# Patient Record
Sex: Female | Born: 1985 | Race: Black or African American | Hispanic: No | Marital: Single | Smoking: Current every day smoker
Health system: Southern US, Community
[De-identification: ages and names within clinical notes are randomized; demographics above are authoritative.]

---
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
## 2007-05-31 ENCOUNTER — Emergency Department (HOSPITAL_COMMUNITY): Admission: EM | Admit: 2007-05-31 | Discharge: 2007-05-31 | Payer: Self-pay | Admitting: Emergency Medicine

## 2008-05-17 ENCOUNTER — Emergency Department (HOSPITAL_BASED_OUTPATIENT_CLINIC_OR_DEPARTMENT_OTHER): Admission: EM | Admit: 2008-05-17 | Discharge: 2008-05-17 | Payer: Self-pay | Admitting: Emergency Medicine

## 2008-06-15 ENCOUNTER — Emergency Department (HOSPITAL_BASED_OUTPATIENT_CLINIC_OR_DEPARTMENT_OTHER): Admission: EM | Admit: 2008-06-15 | Discharge: 2008-06-15 | Payer: Self-pay | Admitting: Emergency Medicine

## 2008-10-20 ENCOUNTER — Emergency Department (HOSPITAL_BASED_OUTPATIENT_CLINIC_OR_DEPARTMENT_OTHER): Admission: EM | Admit: 2008-10-20 | Discharge: 2008-10-20 | Payer: Self-pay | Admitting: Emergency Medicine

## 2010-02-15 ENCOUNTER — Emergency Department (HOSPITAL_BASED_OUTPATIENT_CLINIC_OR_DEPARTMENT_OTHER)
Admission: EM | Admit: 2010-02-15 | Discharge: 2010-02-15 | Payer: Self-pay | Source: Home / Self Care | Admitting: Emergency Medicine

## 2010-06-07 LAB — WET PREP, GENITAL

## 2010-06-07 LAB — DIFFERENTIAL
Eosinophils Relative: 1 % (ref 0–5)
Lymphocytes Relative: 12 % (ref 12–46)
Lymphs Abs: 1.7 10*3/uL (ref 0.7–4.0)
Monocytes Absolute: 1.2 10*3/uL — ABNORMAL HIGH (ref 0.1–1.0)
Neutro Abs: 11.2 10*3/uL — ABNORMAL HIGH (ref 1.7–7.7)

## 2010-06-07 LAB — CBC
MCHC: 34.3 g/dL (ref 30.0–36.0)
MCV: 89.3 fL (ref 78.0–100.0)
Platelets: 251 10*3/uL (ref 150–400)
RDW: 12.3 % (ref 11.5–15.5)
WBC: 14.4 10*3/uL — ABNORMAL HIGH (ref 4.0–10.5)

## 2010-06-07 LAB — URINALYSIS, ROUTINE W REFLEX MICROSCOPIC
Bilirubin Urine: NEGATIVE
Glucose, UA: NEGATIVE mg/dL
Ketones, ur: NEGATIVE mg/dL
Nitrite: POSITIVE — AB
Protein, ur: 100 mg/dL — AB
Specific Gravity, Urine: 1.018 (ref 1.005–1.030)
Urobilinogen, UA: 1 mg/dL (ref 0.0–1.0)
pH: 6 (ref 5.0–8.0)

## 2010-06-07 LAB — PREGNANCY, URINE: Preg Test, Ur: POSITIVE

## 2010-06-07 LAB — GC/CHLAMYDIA PROBE AMP, GENITAL
Chlamydia, DNA Probe: NEGATIVE
GC Probe Amp, Genital: NEGATIVE

## 2010-06-07 LAB — COMPREHENSIVE METABOLIC PANEL
AST: 17 U/L (ref 0–37)
Albumin: 4 g/dL (ref 3.5–5.2)
BUN: 7 mg/dL (ref 6–23)
Calcium: 8.7 mg/dL (ref 8.4–10.5)
Chloride: 105 mEq/L (ref 96–112)
Creatinine, Ser: 0.7 mg/dL (ref 0.4–1.2)
GFR calc Af Amer: >60 mL/min (ref >60)
Total Protein: 7.6 g/dL (ref 6.0–8.3)

## 2010-06-07 LAB — URINE MICROSCOPIC-ADD ON

## 2010-06-07 LAB — RPR: RPR Ser Ql: NONREACTIVE

## 2010-07-16 ENCOUNTER — Emergency Department (HOSPITAL_BASED_OUTPATIENT_CLINIC_OR_DEPARTMENT_OTHER)
Admission: EM | Admit: 2010-07-16 | Discharge: 2010-07-16 | Disposition: A | Discharge status: Home / Self Care | Payer: Medicaid Other | Attending: Emergency Medicine | Admitting: Emergency Medicine

## 2010-07-16 DIAGNOSIS — L089 Local infection of the skin and subcutaneous tissue, unspecified: Secondary | ICD-10-CM | POA: Insufficient documentation

## 2011-01-03 ENCOUNTER — Emergency Department (HOSPITAL_BASED_OUTPATIENT_CLINIC_OR_DEPARTMENT_OTHER)
Admission: EM | Admit: 2011-01-03 | Discharge: 2011-01-03 | Disposition: A | Discharge status: Home / Self Care | Payer: No Typology Code available for payment source | Attending: Emergency Medicine | Admitting: Emergency Medicine

## 2011-01-03 ENCOUNTER — Encounter: Payer: Self-pay | Admitting: Family Medicine

## 2011-01-03 ENCOUNTER — Emergency Department (INDEPENDENT_AMBULATORY_CARE_PROVIDER_SITE_OTHER): Payer: No Typology Code available for payment source

## 2011-01-03 ENCOUNTER — Other Ambulatory Visit: Payer: Self-pay

## 2011-01-03 DIAGNOSIS — Y92410 Unspecified street and highway as the place of occurrence of the external cause: Secondary | ICD-10-CM | POA: Insufficient documentation

## 2011-01-03 DIAGNOSIS — R0789 Other chest pain: Secondary | ICD-10-CM | POA: Insufficient documentation

## 2011-01-03 DIAGNOSIS — M545 Low back pain, unspecified: Secondary | ICD-10-CM | POA: Insufficient documentation

## 2011-01-03 DIAGNOSIS — M25519 Pain in unspecified shoulder: Secondary | ICD-10-CM
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
## 2011-01-08 ENCOUNTER — Emergency Department (HOSPITAL_BASED_OUTPATIENT_CLINIC_OR_DEPARTMENT_OTHER)
Admission: EM | Admit: 2011-01-08 | Discharge: 2011-01-08 | Disposition: A | Discharge status: Home / Self Care | Payer: No Typology Code available for payment source | Attending: Emergency Medicine | Admitting: Emergency Medicine

## 2011-01-08 ENCOUNTER — Encounter (HOSPITAL_BASED_OUTPATIENT_CLINIC_OR_DEPARTMENT_OTHER): Payer: Self-pay

## 2011-01-08 DIAGNOSIS — F17200 Nicotine dependence, unspecified, uncomplicated: Secondary | ICD-10-CM | POA: Insufficient documentation

## 2011-01-08 DIAGNOSIS — T148XXA Other injury of unspecified body region, initial encounter: Secondary | ICD-10-CM

## 2011-01-08 DIAGNOSIS — IMO0002 Reserved for concepts with insufficient information to code with codable children: Secondary | ICD-10-CM | POA: Insufficient documentation

## 2011-01-08 MED ORDER — CYCLOBENZAPRINE HCL 5 MG PO TABS: 5.0000 mg | ORAL_TABLET | Freq: Three times a day (TID) | ORAL | Status: AC | PRN
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
## 2011-01-08 NOTE — ED Provider Notes (Signed)
History/physical exam/procedure(s) were performed by non-physician practitioner and as supervising physician I was immediately available for consultation/collaboration. I have reviewed all notes and am in agreement with care and plan.   Hilario Quarry, MD 01/08/11 2227
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
## 2014-06-27 ENCOUNTER — Encounter (HOSPITAL_BASED_OUTPATIENT_CLINIC_OR_DEPARTMENT_OTHER): Payer: Self-pay

## 2014-06-27 ENCOUNTER — Emergency Department (HOSPITAL_BASED_OUTPATIENT_CLINIC_OR_DEPARTMENT_OTHER)
Admission: EM | Admit: 2014-06-27 | Discharge: 2014-06-27 | Disposition: A | Discharge status: Home / Self Care | Payer: Medicaid Other | Attending: Emergency Medicine | Admitting: Emergency Medicine

## 2014-06-27 DIAGNOSIS — Y9389 Activity, other specified: Secondary | ICD-10-CM | POA: Insufficient documentation

## 2014-06-27 DIAGNOSIS — T161XXA Foreign body in right ear, initial encounter: Secondary | ICD-10-CM | POA: Insufficient documentation

## 2014-06-27 DIAGNOSIS — Y9289 Other specified places as the place of occurrence of the external cause: Secondary | ICD-10-CM | POA: Insufficient documentation

## 2014-06-27 DIAGNOSIS — X58XXXA Exposure to other specified factors, initial encounter: Secondary | ICD-10-CM | POA: Insufficient documentation

## 2014-06-27 DIAGNOSIS — Z720 Tobacco use: Secondary | ICD-10-CM | POA: Insufficient documentation

## 2014-06-27 DIAGNOSIS — Y998 Other external cause status: Secondary | ICD-10-CM | POA: Insufficient documentation

## 2014-06-27 NOTE — ED Provider Notes (Signed)
CSN: [id number]     Arrival date & time 06/27/14  [id number] History   First MD Initiated Contact with Patient 06/27/14 [phone]     Chief Complaint  Patient presents with  . Foreign Body in Ear     (Consider location/radiation/quality/duration/timing/severity/associated sxs/prior Treatment) HPI  Pamela Sparks is a 29 y.o. female presenting with foreign body in right ear. Patient stated she was using a Q-tip today and removed only part of it. Patient states she can hear but it is decreased. She denies any fevers, chills, congestion. No headache. Patient denies any ear drainage or external ear pain. Pt condition has been chronic, unchanged. No alleviating or aggravating factors.    History reviewed. No pertinent past medical history. History reviewed. No pertinent past surgical history. No family history on file. History  Substance Use Topics  . Smoking status: Current Every Day Smoker -- 1.00 packs/day    Types: Cigarettes  . Smokeless tobacco: Not on file  . Alcohol Use: No   OB History    No data available     Review of Systems  Constitutional: Negative for fever and chills.  HENT: Negative for congestion, ear discharge and ear pain.   Skin: Positive for color change. Negative for rash.      Allergies  Review of patient's allergies indicates no known allergies.  Home Medications   Prior to Admission medications   Medication Sig Start Date End Date Taking? Authorizing Provider  levonorgestrel (MIRENA) 20 MCG/24HR IUD 1 each by Intrauterine route once.      Historical Provider, MD   BP 125/83 mmHg  Pulse 82  Temp(Src) 98.2 F (36.8 C) (Oral)  Resp 16  Ht 5\' 2"  (1.575 m)  Wt 120 lb (54.432 kg)  BMI 21.94 kg/m2  SpO2 100%  LMP 05/30/2014 Physical Exam  Constitutional: She appears well-developed and well-nourished. No distress.  HENT:  Head: Normocephalic and atraumatic.  Right Ear: External ear normal.  Left Ear: External ear normal.  White foreign body in front  of TM obscuring it. After removal TM without erythema. No retraction. Light reflex intact as well as landmarks. No external ear skin changes or tenderness. No mastoid tenderness.  Eyes: Conjunctivae are normal. Right eye exhibits no discharge. Left eye exhibits no discharge.  Pulmonary/Chest: Effort normal. No respiratory distress.  Neurological: She is alert. Coordination normal.  Skin: She is not diaphoretic.  Psychiatric: She has a normal mood and affect. Her behavior is normal.  Nursing note and vitals reviewed.   ED Course  Procedures (including critical care time) Labs Review Labs Reviewed - No data to display  Imaging Review No results found.   EKG Interpretation None      MDM   Final diagnoses:  Foreign body in ear, right, initial encounter   Patient presenting with foreign-body in right ear with decreased hearing. Foreign body was removed with suction. Patient stated hearing return immediately. TM without evidence of infection. No TM perforation. Patient not to put any Q-tips or foreign bodies in the ear. She is agreeable to this plan.   Oswaldo ConroyVictoria Gabryel Files, PA-C 06/27/14 [id number]  Oswaldo ConroyVictoria Sanya Kobrin, PA-C 06/27/14 1004  Blane OharaJoshua Zavitz, MD 06/29/14 [phone]

## 2014-06-27 NOTE — ED Notes (Signed)
Reports putting q-tip in right ear. Unable to hear at this time

## 2014-06-27 NOTE — Discharge Instructions (Signed)
Return to the emergency room with worsening of symptoms, new symptoms or with symptoms that are concerning, especially fevers, ear drainage, unable to hear out of her right ear, severe headache, dizziness, severe pain. Nothing in your ear. Do not use Q-tips. To remove wax use warm water while in the shower. Read below information and follow recommendations.  Ear Foreign Body An ear foreign body is an object that is stuck in the ear. It is common for young children to put objects into the ear canal. These may include pebbles, beads, beans, and any other small objects which will fit. In adults, objects such as cotton swabs may become lodged in the ear canal. In all ages, the most common foreign bodies are insects that enter the ear canal.  SYMPTOMS  Foreign bodies may cause pain, buzzing or roaring sounds, hearing loss, and ear drainage.  HOME CARE INSTRUCTIONS   Keep all follow-up appointments with your caregiver as told.  Keep small objects out of reach of young children. Tell them not to put anything in their ears. SEEK IMMEDIATE MEDICAL CARE IF:   You have bleeding from the ear.  You have increased pain or swelling of the ear.  You have reduced hearing.  You have discharge coming from the ear.  You have a fever.  You have a headache. MAKE SURE YOU:   Understand these instructions.  Will watch your condition.  Will get help right away if you are not doing well or get worse. Document Released: 02/09/2000 Document Revised: 05/06/2011 Document Reviewed: 09/30/2007 [facility] Patient Information 2015 [place], [place]. This information is not intended to replace advice given to you by your health care provider. Make sure you discuss any questions you have with your health care provider.

## 2015-08-23 ENCOUNTER — Emergency Department (HOSPITAL_BASED_OUTPATIENT_CLINIC_OR_DEPARTMENT_OTHER)
Admission: EM | Admit: 2015-08-23 | Discharge: 2015-08-23 | Disposition: A | Discharge status: Home / Self Care | Payer: BLUE CROSS/BLUE SHIELD | Attending: Emergency Medicine | Admitting: Emergency Medicine

## 2015-08-23 ENCOUNTER — Emergency Department (HOSPITAL_BASED_OUTPATIENT_CLINIC_OR_DEPARTMENT_OTHER): Payer: BLUE CROSS/BLUE SHIELD

## 2015-08-23 ENCOUNTER — Encounter (HOSPITAL_BASED_OUTPATIENT_CLINIC_OR_DEPARTMENT_OTHER): Payer: Self-pay | Admitting: *Deleted

## 2015-08-23 DIAGNOSIS — N390 Urinary tract infection, site not specified: Secondary | ICD-10-CM | POA: Diagnosis not present

## 2015-08-23 DIAGNOSIS — F17210 Nicotine dependence, cigarettes, uncomplicated: Secondary | ICD-10-CM | POA: Diagnosis not present

## 2015-08-23 DIAGNOSIS — R112 Nausea with vomiting, unspecified: Secondary | ICD-10-CM | POA: Diagnosis present

## 2015-08-23 DIAGNOSIS — J40 Bronchitis, not specified as acute or chronic: Secondary | ICD-10-CM | POA: Diagnosis not present

## 2015-08-23 LAB — COMPREHENSIVE METABOLIC PANEL
ALBUMIN: 4.5 g/dL (ref 3.5–5.0)
ALT: 15 U/L (ref 14–54)
ANION GAP: 6 (ref 5–15)
AST: 20 U/L (ref 15–41)
Alkaline Phosphatase: 61 U/L (ref 38–126)
BUN: 10 mg/dL (ref 6–20)
CHLORIDE: 105 mmol/L (ref 101–111)
CO2: 24 mmol/L (ref 22–32)
Calcium: 9.1 mg/dL (ref 8.9–10.3)
Creatinine, Ser: 0.92 mg/dL (ref 0.44–1.00)
GFR calc Af Amer: >60 mL/min (ref >60)
GFR calc non Af Amer: >60 mL/min (ref >60)
GLUCOSE: 94 mg/dL (ref 65–99)
POTASSIUM: 3.4 mmol/L — AB (ref 3.5–5.1)
SODIUM: 135 mmol/L (ref 135–145)
Total Bilirubin: 0.6 mg/dL (ref 0.3–1.2)
Total Protein: 7.7 g/dL (ref 6.5–8.1)

## 2015-08-23 LAB — URINALYSIS, ROUTINE W REFLEX MICROSCOPIC
Glucose, UA: NEGATIVE mg/dL
Ketones, ur: 15 mg/dL — AB
NITRITE: NEGATIVE
PROTEIN: 30 mg/dL — AB
Specific Gravity, Urine: 1.027 (ref 1.005–1.030)
pH: 5.5 (ref 5.0–8.0)

## 2015-08-23 LAB — URINE MICROSCOPIC-ADD ON

## 2015-08-23 LAB — CBC
HCT: 41.2 % (ref 36.0–46.0)
Hemoglobin: 14.7 g/dL (ref 12.0–15.0)
MCH: 32.4 pg (ref 26.0–34.0)
MCHC: 35.7 g/dL (ref 30.0–36.0)
MCV: 90.7 fL (ref 78.0–100.0)
Platelets: 188 10*3/uL (ref 150–400)
RBC: 4.54 MIL/uL (ref 3.87–5.11)
RDW: 12.7 % (ref 11.5–15.5)
WBC: 10 10*3/uL (ref 4.0–10.5)

## 2015-08-23 LAB — LIPASE, BLOOD: Lipase: 13 U/L (ref 11–51)

## 2015-08-23 LAB — PREGNANCY, URINE: PREG TEST UR: NEGATIVE

## 2015-08-23 MED ORDER — SODIUM CHLORIDE 0.9 % IV BOLUS (SEPSIS)
2000.0000 mL | Freq: Once | INTRAVENOUS | Status: AC
  Administered 2015-08-23: 2000 mL via INTRAVENOUS

## 2015-08-23 MED ORDER — ALBUTEROL SULFATE (2.5 MG/3ML) 0.083% IN NEBU
5.0000 mg | INHALATION_SOLUTION | Freq: Once | RESPIRATORY_TRACT | Status: AC
  Administered 2015-08-23: 5 mg via RESPIRATORY_TRACT
  Filled 2015-08-23: qty 6

## 2015-08-23 MED ORDER — CEPHALEXIN 500 MG PO CAPS: 500.0000 mg | ORAL_CAPSULE | Freq: Four times a day (QID) | ORAL | Status: DC

## 2015-08-23 MED ORDER — ALBUTEROL SULFATE HFA 108 (90 BASE) MCG/ACT IN AERS
1.0000 | INHALATION_SPRAY | RESPIRATORY_TRACT | Status: DC | PRN
  Administered 2015-08-23: 2 via RESPIRATORY_TRACT
  Filled 2015-08-23: qty 6.7

## 2015-08-23 MED ORDER — DEXTROSE 5 % IV SOLN
1.0000 g | Freq: Once | INTRAVENOUS | Status: AC
  Administered 2015-08-23: 1 g via INTRAVENOUS
  Filled 2015-08-23: qty 10

## 2015-08-23 MED ORDER — DEXAMETHASONE SODIUM PHOSPHATE 10 MG/ML IJ SOLN
10.0000 mg | Freq: Once | INTRAMUSCULAR | Status: AC
Start: 2015-08-23 — End: 2015-08-23
  Administered 2015-08-23: 10 mg via INTRAVENOUS
  Filled 2015-08-23: qty 1

## 2015-08-23 NOTE — ED Notes (Signed)
Patient transported to X-ray 

## 2015-08-23 NOTE — ED Notes (Signed)
MD at bedside. 

## 2015-08-23 NOTE — Discharge Instructions (Signed)
You were seen and evaluated today for your symptoms of generally not feeling well as well as her inability to eat secondary to nauseousness. Your results were consistent with bronchitis as well as probable urinary tract infection. Use the inhaler as needed for shortness of breath. Take the antibiotics for UTI. Please keep yourself well-hydrated as it appears you were dehydrated secondary to being sick. Drink at least 8x8 ounce glasses of water a day. Follow-up with your primary care physician for reevaluation.  Acute Bronchitis Bronchitis is inflammation of the airways that extend from the windpipe into the lungs (bronchi). The inflammation often causes mucus to develop. This leads to a cough, which is the most common symptom of bronchitis.  In acute bronchitis, the condition usually develops suddenly and goes away over time, usually in a couple weeks. Smoking, allergies, and asthma can make bronchitis worse. Repeated episodes of bronchitis may cause further lung problems.  CAUSES Acute bronchitis is most often caused by the same virus that causes a cold. The virus can spread from person to person (contagious) through coughing, sneezing, and touching contaminated objects. SIGNS AND SYMPTOMS   Cough.   Fever.   Coughing up mucus.   Body aches.   Chest congestion.   Chills.   Shortness of breath.   Sore throat.  DIAGNOSIS  Acute bronchitis is usually diagnosed through a physical exam. Your health care provider will also ask you questions about your medical history. Tests, such as chest X-rays, are sometimes done to rule out other conditions.  TREATMENT  Acute bronchitis usually goes away in a couple weeks. Oftentimes, no medical treatment is necessary. Medicines are sometimes given for relief of fever or cough. Antibiotic medicines are usually not needed but may be prescribed in certain situations. In some cases, an inhaler may be recommended to help reduce shortness of breath and  control the cough. A cool mist vaporizer may also be used to help thin bronchial secretions and make it easier to clear the chest.  HOME CARE INSTRUCTIONS  Get plenty of rest.   Drink enough fluids to keep your urine clear or pale yellow (unless you have a medical condition that requires fluid restriction). Increasing fluids may help thin your respiratory secretions (sputum) and reduce chest congestion, and it will prevent dehydration.   Take medicines only as directed by your health care provider.  If you were prescribed an antibiotic medicine, finish it all even if you start to feel better.  Avoid smoking and secondhand smoke. Exposure to cigarette smoke or irritating chemicals will make bronchitis worse. If you are a smoker, consider using nicotine gum or skin patches to help control withdrawal symptoms. Quitting smoking will help your lungs heal faster.   Reduce the chances of another bout of acute bronchitis by washing your hands frequently, avoiding people with cold symptoms, and trying not to touch your hands to your mouth, nose, or eyes.   Keep all follow-up visits as directed by your health care provider.  SEEK MEDICAL CARE IF: Your symptoms do not improve after 1 week of treatment.  SEEK IMMEDIATE MEDICAL CARE IF:  You develop an increased fever or chills.   You have chest pain.   You have severe shortness of breath.  You have bloody sputum.   You develop dehydration.  You faint or repeatedly feel like you are going to pass out.  You develop repeated vomiting.  You develop a severe headache. MAKE SURE YOU:   Understand these instructions.  Will watch your  condition.  Will get help right away if you are not doing well or get worse.   This information is not intended to replace advice given to you by your health care provider. Make sure you discuss any questions you have with your health care provider.   Document Released: 03/21/2004 Document Revised:  03/04/2014 Document Reviewed: 08/04/2012 Elsevier Interactive Patient Education 2016 Elsevier Inc.   Urinary Tract Infection Urinary tract infections (UTIs) can develop anywhere along your urinary tract. Your urinary tract is your body's drainage system for removing wastes and extra water. Your urinary tract includes two kidneys, two ureters, a bladder, and a urethra. Your kidneys are a pair of bean-shaped organs. Each kidney is about the size of your fist. They are located below your ribs, one on each side of your spine. CAUSES Infections are caused by microbes, which are microscopic organisms, including fungi, viruses, and bacteria. These organisms are so small that they can only be seen through a microscope. Bacteria are the microbes that most commonly cause UTIs. SYMPTOMS  Symptoms of UTIs may vary by age and gender of the patient and by the location of the infection. Symptoms in young women typically include a frequent and intense urge to urinate and a painful, burning feeling in the bladder or urethra during urination. Older women and men are more likely to be tired, shaky, and weak and have muscle aches and abdominal pain. A fever may mean the infection is in your kidneys. Other symptoms of a kidney infection include pain in your back or sides below the ribs, nausea, and vomiting. DIAGNOSIS To diagnose a UTI, your caregiver will ask you about your symptoms. Your caregiver will also ask you to provide a urine sample. The urine sample will be tested for bacteria and white blood cells. White blood cells are made by your body to help fight infection. TREATMENT  Typically, UTIs can be treated with medication. Because most UTIs are caused by a bacterial infection, they usually can be treated with the use of antibiotics. The choice of antibiotic and length of treatment depend on your symptoms and the type of bacteria causing your infection. HOME CARE INSTRUCTIONS  If you were prescribed antibiotics,  take them exactly as your caregiver instructs you. Finish the medication even if you feel better after you have only taken some of the medication.  Drink enough water and fluids to keep your urine clear or pale yellow.  Avoid caffeine, tea, and carbonated beverages. They tend to irritate your bladder.  Empty your bladder often. Avoid holding urine for long periods of time.  Empty your bladder before and after sexual intercourse.  After a bowel movement, women should cleanse from front to back. Use each tissue only once. SEEK MEDICAL CARE IF:   You have back pain.  You develop a fever.  Your symptoms do not begin to resolve within 3 days. SEEK IMMEDIATE MEDICAL CARE IF:   You have severe back pain or lower abdominal pain.  You develop chills.  You have nausea or vomiting.  You have continued burning or discomfort with urination. MAKE SURE YOU:   Understand these instructions.  Will watch your condition.  Will get help right away if you are not doing well or get worse.   This information is not intended to replace advice given to you by your health care provider. Make sure you discuss any questions you have with your health care provider.   Document Released: 11/21/2004 Document Revised: 11/02/2014 Document Reviewed: 03/22/2011  Elsevier Interactive Patient Education ©2016 Elsevier Inc. ° °

## 2015-08-23 NOTE — ED Notes (Signed)
Pt reports 10 days of decreased appetite, body aches, fatigue, and vomiting

## 2015-08-23 NOTE — ED Provider Notes (Signed)
CSN: [id number]     Arrival date & time 08/23/15  0930 History   First MD Initiated Contact with Patient 08/23/15 0945     Chief Complaint  Patient presents with  . Emesis     (Consider location/radiation/quality/duration/timing/severity/associated sxs/prior Treatment) HPI Comments: 30 year old female with no significant past medical history presents for 10 days of decreased appetite, body aches, fatigue, nausea and vomiting. She denies any throat pain. She denies any known fever. She says she has been somewhat short of breath but denies any cough. She says she does intermittently have headaches. She has been trying to force yourself to drink fluids without much improvement. Denies any known sick contacts. Denies abdominal pain except for when she is vomiting. She does not believe that she could be pregnant. Has had decreased urination and dark colored urine.  Patient is a 30 y.o. female presenting with vomiting.  Emesis Associated symptoms: myalgias   Associated symptoms: no abdominal pain, no diarrhea and no headaches     History reviewed. No pertinent past medical history. History reviewed. No pertinent past surgical history. No family history on file. Social History  Substance Use Topics  . Smoking status: Current Every Day Smoker -- 1.00 packs/day    Types: Cigarettes  . Smokeless tobacco: Never Used  . Alcohol Use: No   OB History    No data available     Review of Systems  Constitutional: Positive for appetite change and fatigue. Negative for fever.  HENT: Negative for congestion, postnasal drip, rhinorrhea and sinus pressure.   Eyes: Negative for visual disturbance.  Respiratory: Positive for shortness of breath. Negative for cough, chest tightness and wheezing.   Cardiovascular: Negative for chest pain, palpitations and leg swelling.  Gastrointestinal: Positive for nausea and vomiting. Negative for abdominal pain and diarrhea.  Genitourinary: Positive for decreased  urine volume. Negative for dysuria, hematuria and flank pain.  Musculoskeletal: Positive for myalgias. Negative for back pain.  Skin: Negative for rash.  Neurological: Negative for dizziness, weakness and headaches.  Hematological: Does not bruise/bleed easily.      Allergies  Review of patient's allergies indicates no known allergies.  Home Medications   Prior to Admission medications   Medication Sig Start Date End Date Taking? Authorizing Provider  cephALEXin (KEFLEX) 500 MG capsule Take 1 capsule (500 mg total) by mouth 4 (four) times daily. 08/23/15   Leta BaptistEmily Roe Lanier Felty, MD  levonorgestrel (MIRENA) 20 MCG/24HR IUD 1 each by Intrauterine route once.      Historical Provider, MD   BP 100/66 mmHg  Pulse 86  Temp(Src) 99.4 F (37.4 C) (Oral)  Resp 18  Ht 5\' 2"  (1.575 m)  Wt 125 lb (56.7 kg)  BMI 22.86 kg/m2  SpO2 98%  LMP 07/27/2015 (Approximate) Physical Exam  Constitutional: She is oriented to person, place, and time. She appears well-developed and well-nourished. No distress.  HENT:  Head: Normocephalic and atraumatic.  Right Ear: Tympanic membrane and external ear normal.  Left Ear: Tympanic membrane and external ear normal.  Nose: Nose normal.  Mouth/Throat: Oropharynx is clear and moist. Mucous membranes are dry. No oropharyngeal exudate, posterior oropharyngeal edema or posterior oropharyngeal erythema.  Eyes: EOM are normal. Pupils are equal, round, and reactive to light.  Neck: Normal range of motion. Neck supple.  Cardiovascular: Normal rate, regular rhythm, normal heart sounds and intact distal pulses.   No murmur heard. Pulmonary/Chest: Effort normal. No respiratory distress. She has no wheezes. She has no rales.  Abdominal: Soft. She exhibits  no distension. There is no tenderness.  Musculoskeletal: Normal range of motion. She exhibits no edema or tenderness.  Neurological: She is alert and oriented to person, place, and time.  Skin: Skin is warm and dry. No  rash noted. She is not diaphoretic.  Vitals reviewed.   ED Course  Procedures (including critical care time) Labs Review Labs Reviewed  URINALYSIS, ROUTINE W REFLEX MICROSCOPIC (NOT AT [facility]) - Abnormal; Notable for the following:    Color, Urine AMBER (*)    APPearance TURBID (*)    Hgb urine dipstick LARGE (*)    Bilirubin Urine SMALL (*)    Ketones, ur 15 (*)    Protein, ur 30 (*)    Leukocytes, UA SMALL (*)    All other components within normal limits  COMPREHENSIVE METABOLIC PANEL - Abnormal; Notable for the following:    Potassium 3.4 (*)    All other components within normal limits  URINE MICROSCOPIC-ADD ON - Abnormal; Notable for the following:    Squamous Epithelial / LPF 0-5 (*)    Bacteria, UA MANY (*)    All other components within normal limits  URINE CULTURE  PREGNANCY, URINE  CBC  LIPASE, BLOOD    Imaging Review Dg Chest 2 View  08/23/2015  CLINICAL DATA:  Chest pain, shortness of breath, near syncope, nausea, vomiting, diarrhea, nervousness, and fatigue, symptoms for 10 days, smoker EXAM: CHEST  2 VIEW COMPARISON:  01/03/2011 FINDINGS: Normal heart size, mediastinal contours, and pulmonary vascularity. Mild chronic bronchitic changes. Lungs otherwise clear. No pleural effusion or pneumothorax. No acute bony abnormalities. IMPRESSION: Mild chronic bronchitic changes without infiltrate. Electronically Signed   By: Ulyses SouthwardMark  Boles M.D.   On: 08/23/2015 10:18   I have personally reviewed and evaluated these images and lab results as part of my medical decision-making.   EKG Interpretation None      MDM  Patient was seen and evaluated in stable condition. Benign examination although patient appeared dehydrated. Chest x-ray with bronchitic changes. Patient did admit to being a smoker and was educated on the need for smoking cessation. She was given a breathing treatment with improvement in her breathing. Laboratory studies were unremarkable other than UA concerning for  infection. Urine culture was sent. Patient was given a dose of Rocephin. Patient was rehydrated with 2 L of saline. On reevaluation she felt significantly improved and was able to tolerate by mouth without difficulty. She expressed understanding and agreement with plan for discharge and outpatient follow-up. Patient was discharged home with a prescription for Keflex and with albuterol inhaler. She was given a dose of Decadron in the emergency department. She was instructed to keep herself well-hydrated. Final diagnoses:  Bronchitis  UTI (lower urinary tract infection)    1.  Bronchitis 2. UTI    Leta BaptistEmily Roe Lezlee Gills, MD 08/23/15 1321

## 2015-08-23 NOTE — ED Notes (Signed)
Pt directed to pharmacy to pick up prescriptions. Pt has a ride at bedside

## 2015-08-25 LAB — URINE CULTURE: Culture: >=100000 — AB

## 2015-08-26 ENCOUNTER — Telehealth (HOSPITAL_BASED_OUTPATIENT_CLINIC_OR_DEPARTMENT_OTHER): Payer: Self-pay

## 2015-08-26 NOTE — Telephone Encounter (Signed)
Post ED Visit - Positive Culture Follow-up  Culture report reviewed by antimicrobial stewardship pharmacist:  []  Pamela Sparks, Pharm.D. []  [name], Pharm.D., BCPS []  Garvin FilaMike Maccia, Pharm.D. []  Georgina [name], Pharm.D., BCPS []  HouseMinh Sparks, [address] BoulevardPharm.D., BCPS, AAHIVP []  Estella HuskMichelle Sparks, Pharm.D., BCPS, AAHIVP [x]  Pamela Sparks, Pharm.D. []  [name], Pharm.D.  Positive urine culture Treated with Cephalexin, organism sensitive to the same and no further patient follow-up is required at this time.  Pamela Sparks, Pamela Sparks 08/26/2015, 2:24 PM

## 2015-08-27 ENCOUNTER — Emergency Department (HOSPITAL_BASED_OUTPATIENT_CLINIC_OR_DEPARTMENT_OTHER)
Admission: EM | Admit: 2015-08-27 | Discharge: 2015-08-27 | Disposition: A | Discharge status: Home / Self Care | Payer: BLUE CROSS/BLUE SHIELD | Attending: Emergency Medicine | Admitting: Emergency Medicine

## 2015-08-27 ENCOUNTER — Encounter (HOSPITAL_BASED_OUTPATIENT_CLINIC_OR_DEPARTMENT_OTHER): Payer: Self-pay | Admitting: *Deleted

## 2015-08-27 DIAGNOSIS — F419 Anxiety disorder, unspecified: Secondary | ICD-10-CM | POA: Diagnosis not present

## 2015-08-27 DIAGNOSIS — F17210 Nicotine dependence, cigarettes, uncomplicated: Secondary | ICD-10-CM | POA: Diagnosis not present

## 2015-08-27 DIAGNOSIS — Z79899 Other long term (current) drug therapy: Secondary | ICD-10-CM | POA: Insufficient documentation

## 2015-08-27 LAB — URINE MICROSCOPIC-ADD ON

## 2015-08-27 LAB — RAPID URINE DRUG SCREEN, HOSP PERFORMED
Amphetamines: NOT DETECTED
BARBITURATES: NOT DETECTED
Benzodiazepines: NOT DETECTED
Cocaine: NOT DETECTED
Opiates: NOT DETECTED
Tetrahydrocannabinol: POSITIVE — AB

## 2015-08-27 LAB — URINALYSIS, ROUTINE W REFLEX MICROSCOPIC
BILIRUBIN URINE: NEGATIVE
GLUCOSE, UA: NEGATIVE mg/dL
Nitrite: NEGATIVE
PROTEIN: NEGATIVE mg/dL
Specific Gravity, Urine: 1.022 (ref 1.005–1.030)
pH: 6 (ref 5.0–8.0)

## 2015-08-27 LAB — PREGNANCY, URINE: Preg Test, Ur: NEGATIVE

## 2015-08-27 MED ORDER — SULFAMETHOXAZOLE-TRIMETHOPRIM 800-160 MG PO TABS: 1.0000 | ORAL_TABLET | Freq: Two times a day (BID) | ORAL | Status: AC

## 2015-08-27 NOTE — ED Notes (Addendum)
Pt reports panic attacks/anxiety (states this is new for her) that began Friday. Denies suicidal ideation (states her thought process is mostly that she needs to run away and that she feels as if she's lost control of her life). Denies homicidal ideation. States she was feeling this way very mildly when she was previously seen, but has worsened significantly since that time. Pt reports she wonders if the Keflex has caused these symptoms. Reports feeling very depressed and that it's hindered her ability to function at work and in her family life.

## 2015-08-27 NOTE — ED Notes (Signed)
TTS consult in progress. °

## 2015-08-27 NOTE — Discharge Instructions (Signed)
Read the information below.  You spoke with a therapist who recommends outpatient therapy for your anxiety and depression.  Your urine still shows signs of infection. Discontinue the Keflex. You are being prescribed a different antibiotic. Take as prescribed. You are showing a little dehydration. Be sure to drink plenty of fluids.   Use the prescribed medication as directed.  Please discuss all new medications with your pharmacist.   You may return to the Emergency Department at any time for worsening condition or any new symptoms that concern you.  If you have suicidal or homicidal thoughts it is important that [facility]. Return to ED if your symptoms worsen or you develop chest pain, shortness of breath, loss of consciousness.   Panic Attacks Panic attacks are sudden, short feelings of great fear or discomfort. You may have them for no reason when you are relaxed, when you are uneasy (anxious), or when you are sleeping.  HOME CARE  Take all your medicines as told.  Check with your doctor before starting new medicines.  Keep all doctor visits. GET HELP IF:  You are not able to take your medicines as told.  Your symptoms do not get better.  Your symptoms get worse. GET HELP RIGHT AWAY IF:  Your attacks seem different than your normal attacks.  You have thoughts about hurting yourself or others.  You take panic attack medicine and you have a side effect. MAKE SURE YOU:  Understand these instructions.  Will watch your condition.  Will get help right away if you are not doing well or get worse.   This information is not intended to replace advice given to you by your health care provider. Make sure you discuss any questions you have with your health care provider.   Document Released: 03/16/2010 Document Revised: 12/02/2012 Document Reviewed: 09/25/2012 Elsevier Interactive Patient Education [employer]2016 [employer]

## 2015-08-27 NOTE — ED Provider Notes (Signed)
CSN: [id number]     Arrival date & time 08/27/15  1005 History   First MD Initiated Contact with Patient 08/27/15 1042     Chief Complaint  Patient presents with  . Panic Attack  . Depression     (Consider location/radiation/quality/duration/timing/severity/associated sxs/prior Treatment) HPI Comments: Pamela Sparks is a 30 y.o. female presents to emergency Department with anxiety and depression. Patient reports approximately one week ago she had sexual encounter with a new partner and following the encounter she became consumed with concern for exposure to a disease. She was seen in the ED on Wednesday and diagnosed with a UTI and bronchitis. After her encounter in the ED she endorses a feeling of "dying." She endorses anxiousness, depression, anhedonia, decreased appetite, decreased concentration, sleep disturbances. Patient states she has constant anxiousness and panic attacks daily. She denies suicidal or homicidal ideation; however, she states "I just want to get away," on further questioning she states she just wants to be alone. Question of hallucinations, pt states sometimes she thinks she "hears her name" or "sees something." She is a current smoker. Denies recreational drug use. Denies medical conditions. No chest pain, shortness of breath, dysuria, hematuria, abdominal pain, vomiting, diarrhea, constipation.   Patient is a 30 y.o. female presenting with depression. The history is provided by the patient, a parent and medical records.  Depression Associated symptoms include nausea.    History reviewed. No pertinent past medical history. History reviewed. No pertinent past surgical history. No family history on file. Social History  Substance Use Topics  . Smoking status: Current Every Day Smoker -- 1.00 packs/day    Types: Cigarettes  . Smokeless tobacco: Never Used  . Alcohol Use: No   OB History    No data available     Review of Systems  Constitutional: Positive for  appetite change.  Gastrointestinal: Positive for nausea.  Psychiatric/Behavioral: Positive for depression, sleep disturbance and decreased concentration. The patient is nervous/anxious.   All other systems reviewed and are negative.     Allergies  Review of patient's allergies indicates no known allergies.  Home Medications   Prior to Admission medications   Medication Sig Start Date End Date Taking? Authorizing Provider  levonorgestrel (MIRENA) 20 MCG/24HR IUD 1 each by Intrauterine route once.     Yes Historical Provider, MD  sulfamethoxazole-trimethoprim (BACTRIM DS,SEPTRA DS) 800-160 MG tablet Take 1 tablet by mouth 2 (two) times daily. 08/27/15 09/03/15  Lona KettleAshley Laurel Aariel Ems, PA-C   BP 106/71 mmHg  Pulse 76  Temp(Src) 99.1 F (37.3 C) (Oral)  Resp 16  Ht 5\' 2"  (1.575 m)  Wt 56.7 kg  BMI 22.86 kg/m2  SpO2 99%  LMP 07/27/2015 (Approximate) Physical Exam  Constitutional: She appears well-developed and well-nourished. No distress.  HENT:  Head: Normocephalic and atraumatic.  Mouth/Throat: Oropharynx is clear and moist. No oropharyngeal exudate.  Eyes: Conjunctivae and EOM are normal. Pupils are equal, round, and reactive to light. Right eye exhibits no discharge. Left eye exhibits no discharge. No scleral icterus.  Neck: Normal range of motion. Neck supple.  Cardiovascular: Normal rate, regular rhythm, normal heart sounds and intact distal pulses.   No murmur heard. Pulmonary/Chest: Effort normal and breath sounds normal. No respiratory distress.  Abdominal: Soft. Bowel sounds are normal. There is no tenderness. There is no rebound and no guarding.  Musculoskeletal: Normal range of motion.  Lymphadenopathy:    She has no cervical adenopathy.  Neurological: She is alert. Coordination normal.  Skin: Skin is warm  and dry. She is not diaphoretic.  Psychiatric: Her speech is normal. Thought content normal. Her mood appears anxious. She expresses no homicidal and no suicidal  ideation.    ED Course  Procedures (including critical care time) Labs Review Labs Reviewed  URINALYSIS, ROUTINE W REFLEX MICROSCOPIC (NOT AT [facility]) - Abnormal; Notable for the following:    APPearance CLOUDY (*)    Hgb urine dipstick SMALL (*)    Ketones, ur >80 (*)    Leukocytes, UA TRACE (*)    All other components within normal limits  URINE RAPID DRUG SCREEN, HOSP PERFORMED - Abnormal; Notable for the following:    Tetrahydrocannabinol POSITIVE (*)    All other components within normal limits  URINE MICROSCOPIC-ADD ON - Abnormal; Notable for the following:    Squamous Epithelial / LPF 6-30 (*)    Bacteria, UA MANY (*)    All other components within normal limits  URINE CULTURE  PREGNANCY, URINE    Imaging Review No results found. I have personally reviewed and evaluated these images and lab results as part of my medical decision-making.   EKG Interpretation None      MDM   Final diagnoses:  Anxiety    Patient is afebrile and well-appearing in NAD. Pt is anxious appearing and tearful during H&P; however, she maintains good eye contact. She denies SI/HI at this time. Vital signs are stable. Physical exam re-assuring. Suspect new onset of anxiety and depression. Upreg negative. UDS positive for THC. U/A remarkable for continued UTI. Cx urine and change ABX. TTS recommends outpatient therapy for anxiety and depression. Resources provided. Discussed plan with pt. Encouraged [facility] to [facility] if SI/HI. Discussed return precautions. Pt voices understanding and is agreeable.     Lona Kettleshley Laurel Alfonsa Vaile, PA-C 08/27/15 1346  Richardean Canalavid H Yao, MD 08/27/15 [id number]

## 2015-08-27 NOTE — BH Assessment (Signed)
Tele Assessment Note   Pamela Sparks is a 30 y.o. female who [facility] with complaints of anxiety, panic attacks, and depressive symptoms.  Pt reported as follows:  On or about June 22nd, Pt began to experience severe anxiety-like symptoms, include racing thoughts, shaking, and rumination.  She reported that the symptoms started when she was thinking about a disease and wondered if she had it.  The thought "got stuck" and remained.  Pt reported that since then, she has experienced daily episodes of panic (rapid heartbeat, fast breathing, sense of being overwhelmed) that last on and off all day.  Pt stated further that she has begun experiencing depressive symptoms -- despondency, poor appetite ("I can't keep anything down") and poor sleep.  Pt denied any previous mental health concerns prior to late June, and she denied any history or contemporaneous report of abuse, trauma, or stressors other than a new job.  Pt also denied use of any street drugs or alcohol.  Pt stated that she is not suicidal.  She reported that she came to the hospital "not knowing what to expect ... But I do think I should talk to someone."    Pt stated that she has never been in therapy or consulted with a psychiatrist.  Pt lives with her mother, a sister, and her three children.  During assessment, Pt was calm and cooperative.  She had good eye contact.  She was dressed in street clothes and was resting comfortably.  Pt reported mood as both anxious and depressed.  Affect was congruent.  Pt denied suicidal ideation, previous suicide attempts, homicidal ideation, self-injury, or hallucination ("although I thought I heard someone calling my name").  Pt endorsed poor sleep, poor appetite, increased sense of worry, restlessness, panic attacks daily, despondency, racing thoughts, and rumination.  She denied substance use.  Pt's memory and concentration were intact.  Thought processes were within  normal limits and thought content indicated the presence of depressive preoccupation.  Pt denied abuse.  Speech was normal in rate, rhythm, and volume.  Insight, judgment, and impulse control were deemed good.  Pt had no expectations about coming to hospital, but has asked for help with symptoms.  Consulted with Ander Slade. Starkes, NP, who determined that Pt does not meet inpatient criteria.  Faxed a list of outpatient providers to patient.    Diagnosis: Anxiety Disorder, Unspecified  Past Medical History: History reviewed. No pertinent past medical history.  History reviewed. No pertinent past surgical history.  Family History: No family history on file.  Social History:  reports that she has been smoking Cigarettes.  She has been smoking about 1.00 pack per day. She has never used smokeless tobacco. She reports that she does not drink alcohol or use illicit drugs.  Additional Social History:  Alcohol / Drug Use Pain Medications: See PTA Prescriptions: See PTA Over the Counter: See PTA History of alcohol / drug use?: No history of alcohol / drug abuse  CIWA: CIWA-Ar BP: 116/87 mmHg Pulse Rate: 108 COWS:    PATIENT STRENGTHS: (choose at least two) Average or above average intelligence Capable of independent living Communication skills  Allergies: No Known Allergies  Home Medications:  (Not in a hospital admission)  OB/GYN Status:  Patient's last menstrual period was 07/27/2015 (approximate).  General Assessment Data [facility] TTS Assessment: In system Is this a Tele or Face-to-Face Assessment?: Tele Assessment Is this an Initial Assessment or a Re-assessment  for this encounter?: Initial Assessment Marital status: Single Is patient pregnant?: No Pregnancy Status: No Living Arrangements: Parent, Other relatives, Children (Mother, sister, three children) Can pt return to current living arrangement?: Yes Admission  Status: Voluntary Is patient capable of signing voluntary admission?: Yes Referral Source: Self/Family/Friend Insurance type: BCBS  Medical Screening Exam [facility] Walk-in ONLY) Medical Exam completed: Yes  Crisis Care Plan Living Arrangements: Parent, Other relatives, Children (Mother, sister, three children) Name of Psychiatrist: None Name of Therapist: None  Education Status Is patient currently in school?: No  Risk to self with the past 6 months Suicidal Ideation: No Has patient been a risk to self within the past 6 months prior to admission? : No Suicidal Intent: No Has patient had any suicidal intent within the past 6 months prior to admission? : No Is patient at risk for suicide?: No Suicidal Plan?: No Has patient had any suicidal plan within the past 6 months prior to admission? : No Access to Means: No What has been your use of drugs/alcohol within the last 12 months?: Denied Previous Attempts/Gestures: No Intentional Self Injurious Behavior: None Family Suicide History: No Recent stressful life event(s): Other (Comment) (New job) Persecutory voices/beliefs?: No Depression: Yes Depression Symptoms: Despondent (Poor appetite; poor sleep) Substance abuse history and/or treatment for substance abuse?: No Suicide prevention information given to non-admitted patients: Not applicable  Risk to Others within the past 6 months Homicidal Ideation: No Does patient have any lifetime risk of violence toward others beyond the six months prior to admission? : No Thoughts of Harm to Others: No Current Homicidal Intent: No Current Homicidal Plan: No Access to Homicidal Means: No History of harm to others?: No Assessment of Violence: None Noted Does patient have access to weapons?: No Criminal Charges Pending?: No Does patient have a court date: No Is patient on probation?: No  Psychosis Hallucinations: None noted Delusions: None noted  Mental Status  Report Appearance/Hygiene: Unremarkable Eye Contact: Good Motor Activity: Unremarkable Speech: Unremarkable, Logical/coherent Level of Consciousness: Alert Mood: Anxious Affect: Appropriate to circumstance Anxiety Level: Panic Attacks Panic attack frequency: Daily Most recent panic attack: 08/27/15 Thought Processes: Coherent, Relevant Judgement: Unimpaired Orientation: Person, Place, Time, Situation Obsessive Compulsive Thoughts/Behaviors: None  Cognitive Functioning Concentration: Good Memory: Remote Intact, Recent Intact IQ: Average Insight: Good Impulse Control: Good Appetite: Poor Sleep: Decreased Vegetative Symptoms: Staying in bed (Due to anxiety)  ADLScreening [facility] Assessment Services) Patient's cognitive ability adequate to safely complete daily activities?: Yes Patient able to express need for assistance with ADLs?: Yes Independently performs ADLs?: Yes (appropriate for developmental age)  Prior Inpatient Therapy Prior Inpatient Therapy: No  Prior Outpatient Therapy Prior Outpatient Therapy: No Does patient have an ACCT team?: No Does patient have Intensive In-House Services?  : No Does patient have [facility] services? : No Does patient have P4CC services?: No  ADL Screening (condition at time of admission) Patient's cognitive ability adequate to safely complete daily activities?: Yes Is the patient deaf or have difficulty hearing?: No Does the patient have difficulty seeing, even when wearing glasses/contacts?: No Does the patient have difficulty concentrating, remembering, or making decisions?: No Patient able to express need for assistance with ADLs?: Yes Does the patient have difficulty dressing or bathing?: No Independently performs ADLs?: Yes (appropriate for developmental age) Does the patient have difficulty walking or climbing stairs?: No Weakness of Legs: None Weakness of Arms/Hands: None  Home Assistive Devices/Equipment Home Assistive  Devices/Equipment: None  Therapy Consults (therapy consults require a physician order) PT  Evaluation Needed: No OT Evalulation Needed: No SLP Evaluation Needed: No Abuse/Neglect Assessment (Assessment to be complete while patient is alone) Physical Abuse: Denies Verbal Abuse: Denies Sexual Abuse: Denies Exploitation of patient/patient's resources: Denies Self-Neglect: Denies Values / Beliefs Cultural Requests During Hospitalization: None Spiritual Requests During Hospitalization: None Consults Spiritual Care Consult Needed: No Social Work Consult Needed: No Merchant navy officerAdvance Directives (For Healthcare) Does patient have an advance directive?: No Would patient like information on creating an advanced directive?: No - patient declined information    Additional Information 1:1 In Past 12 Months?: No CIRT Risk: No Elopement Risk: Yes Does patient have medical clearance?: Yes     Disposition:  Disposition Initial Assessment Completed for this Encounter: Yes Disposition of Patient: Outpatient treatment (Adult outpatient; referral sources faxed) Type of outpatient treatment: Adult  Earline Mayotteugene T Keva Darty 08/27/2015 12:30 PM

## 2015-08-29 LAB — URINE CULTURE

## 2015-08-30 ENCOUNTER — Telehealth: Payer: Self-pay | Admitting: *Deleted

## 2015-08-30 NOTE — ED Notes (Signed)
Post ED Visit - Positive Culture Follow-up  Culture report reviewed by antimicrobial stewardship pharmacist:  []  Enzo BiNathan Batchelder, Pharm.D. []  Celedonio MiyamotoJeremy Frens, Pharm.D., BCPS [x]  Garvin FilaMike Maccia, Pharm.D. []  Georgina PillionElizabeth Martin, Pharm.D., BCPS []  [place]haven-MoonstoneMinh Pham, [address]Pharm[address]D., BCPS, AAHIVP []  Estella HuskMichelle Turner, Pharm.D., BCPS, AAHIVP []  Tennis Mustassie Stewart, Pharm.D. []  [name], [address]Pharm[address]  Positive urine culture Treated with Sulfamethoxazole-Trimethoprim, organism sensitive to the same and no further patient follow-up is required at this time.  Virl AxeRobertson, Robet Crutchfield [facility] Hospital[facility] 08/30/2015, 9:35 AM

## 2015-12-30 ENCOUNTER — Encounter (HOSPITAL_BASED_OUTPATIENT_CLINIC_OR_DEPARTMENT_OTHER): Payer: Self-pay | Admitting: Emergency Medicine

## 2015-12-30 ENCOUNTER — Emergency Department (HOSPITAL_BASED_OUTPATIENT_CLINIC_OR_DEPARTMENT_OTHER)
Admission: EM | Admit: 2015-12-30 | Discharge: 2015-12-30 | Disposition: A | Discharge status: Home / Self Care | Payer: BLUE CROSS/BLUE SHIELD | Attending: Emergency Medicine | Admitting: Emergency Medicine

## 2015-12-30 DIAGNOSIS — L089 Local infection of the skin and subcutaneous tissue, unspecified: Secondary | ICD-10-CM | POA: Diagnosis not present

## 2015-12-30 DIAGNOSIS — F17210 Nicotine dependence, cigarettes, uncomplicated: Secondary | ICD-10-CM | POA: Diagnosis not present

## 2015-12-30 DIAGNOSIS — R51 Headache: Secondary | ICD-10-CM | POA: Diagnosis not present

## 2015-12-30 DIAGNOSIS — H578 Other specified disorders of eye and adnexa: Secondary | ICD-10-CM | POA: Diagnosis present

## 2015-12-30 MED ORDER — SULFAMETHOXAZOLE-TRIMETHOPRIM 800-160 MG PO TABS: 1.0000 | ORAL_TABLET | Freq: Two times a day (BID) | ORAL | 0 refills | Status: DC

## 2015-12-30 NOTE — Discharge Instructions (Signed)
Please read and follow all provided instructions.  Your diagnoses today include:  1. Skin infection    Tests performed today include:  Vital signs. See below for your results today.   Medications prescribed:   Bactrim (trimethoprim/sulfamethoxazole) - antibiotic  You have been prescribed an antibiotic medicine: take the entire course of medicine even if you are feeling better. Stopping early can cause the antibiotic not to work.  Take any prescribed medications only as directed.   Home care instructions:   Follow any educational materials contained in this packet  Follow-up instructions: Please follow-up with your primary care provider or the dermatologist listed in the next 1 week for further evaluation of your symptoms.   Return instructions:  Return to the Emergency Department if you have:  Fever  Worsening symptoms  Worsening pain  Worsening swelling  Redness of the skin that moves away from the affected area, especially if it streaks away from the affected area   Any other emergent concerns  Your vital signs today were: BP 113/71 (BP Location: Right Arm)    Pulse 92    Temp 98.8 F (37.1 C) (Oral)    Resp 14    Ht 5\' 2"  (1.575 m)    Wt 54.4 kg    LMP 11/29/2015 (Approximate)    SpO2 100%    BMI 21.95 kg/m  If your blood pressure (BP) was elevated above 135/85 this visit, please have this repeated by your doctor within one month. --------------

## 2015-12-30 NOTE — ED Triage Notes (Signed)
Pt reports bump under LT eye x 2 wks; now reports facial pain; bump is where glasses touch face

## 2015-12-30 NOTE — ED Provider Notes (Signed)
[facility] Note   CSN: [id number] Arrival date & time: 12/30/15  0908     History   Chief Complaint Chief Complaint  Patient presents with  . Eye Problem    HPI Pamela Sparks is a 30 y.o. female.  Patient presents with a complaint of a lump under her left eye for the past 2 weeks. Patient states that the area became a little bit larger and swollen. There is some associated pressure but no focal pain. Patient has had associated headaches around her left eye with blurry vision. No loss of vision or dark spots in her vision. No fevers, swelling around the eye, photosensitivity. Approximately 1 week ago, the area drained clear fluid followed by some thick yellow material. No drainage since then. She has been using ice and heat without relief. She has a history of a boil. States that she has recently gotten over an upper respiratory infection. The onset of this condition was acute. The course is constant. Aggravating factors: none. Alleviating factors: none.        History reviewed. No pertinent past medical history.  There are no active problems to display for this patient.   History reviewed. No pertinent surgical history.  OB History    No data available       Home Medications    Prior to Admission medications   Medication Sig Start Date End Date Taking? Authorizing Provider  levonorgestrel (MIRENA) 20 MCG/24HR IUD 1 each by Intrauterine route once.      Historical Provider, MD  sulfamethoxazole-trimethoprim (BACTRIM DS,SEPTRA DS) 800-160 MG tablet Take 1 tablet by mouth 2 (two) times daily. 12/30/15   Renne CriglerJoshua Quirino Kakos, PA-C    Family History History reviewed. No pertinent family history.  Social History Social History  Substance Use Topics  . Smoking status: Current Every Day Smoker    Packs/day: 1.00    Types: Cigarettes  . Smokeless tobacco: Never Used  . Alcohol use No     Allergies   Review of patient's allergies indicates no  known allergies.   Review of Systems Review of Systems  Constitutional: Negative for fever.  HENT: Negative for congestion, ear pain, sinus pressure, sneezing and sore throat.   Eyes: Positive for visual disturbance (blurry only). Negative for photophobia, pain, discharge, redness and itching.  Gastrointestinal: Negative for nausea and vomiting.  Skin: Positive for color change.       Positive for skin nodule.  Neurological: Positive for headaches.  Hematological: Negative for adenopathy.     Physical Exam Updated Vital Signs BP 113/71 (BP Location: Right Arm)   Pulse 92   Temp 98.8 F (37.1 C) (Oral)   Resp 14   Ht 5\' 2"  (1.575 m)   Wt 54.4 kg   LMP 11/29/2015 (Approximate)   SpO2 100%   BMI 21.95 kg/m   Physical Exam  Constitutional: She appears well-developed and well-nourished.  HENT:  Head: Normocephalic and atraumatic.  Right Ear: Tympanic membrane and external ear normal. Tympanic membrane is not erythematous.  Left Ear: Tympanic membrane and external ear normal. Tympanic membrane is not erythematous.  Nose: Nose normal.  Mouth/Throat: Oropharynx is clear and moist.  There is a small 0.5cm nodule inferior to the left lower eyelid. There is overlying erythema but no surrounding erythema or cellulitis. Nodule is freely mobile and nontender. It is not fluctuant.  Eyes: Conjunctivae and lids are normal. Pupils are equal, round, and reactive to light. Right eye exhibits no chemosis, no discharge and  no exudate. Left eye exhibits no chemosis, no discharge and no exudate. Right conjunctiva is not injected. Left conjunctiva is not injected. Right eye exhibits normal extraocular motion. Left eye exhibits normal extraocular motion.  Neck: Normal range of motion. Neck supple.  Pulmonary/Chest: No respiratory distress.  Neurological: She is alert.  Skin: Skin is warm and dry.  Psychiatric: She has a normal mood and affect.  Nursing note and vitals reviewed.    ED  Treatments / Results   Procedures Procedures (including critical care time)  Medications Ordered in ED Medications - No data to display   Initial Impression / Assessment and Plan / ED Course  I have reviewed the triage vital signs and the nursing notes.  Pertinent labs & imaging results that were available during my care of the patient were reviewed by me and considered in my medical decision making (see chart for details).  Clinical Course   Patient seen and examined.   Vital signs reviewed and are as follows: BP 113/71 (BP Location: Right Arm)   Pulse 92   Temp 98.8 F (37.1 C) (Oral)   Resp 14   Ht 5\' 2"  (1.575 m)   Wt 54.4 kg   LMP 11/29/2015 (Approximate)   SpO2 100%   BMI 21.95 kg/m   Area may be an infected epidermal cyst. Will give trial of Bactrim. Patient encouraged to follow-up with PCP/dermatology referral. Encouraged to return to the emergency department with worsening pain, drainage, fever, swelling around the eye.   Final Clinical Impressions(s) / ED Diagnoses   Final diagnoses:  Skin infection   Patient with small nodule, ? Infected sebaceous cyst. It is not Fluctuant and does not require drainage. No systemic symptoms of illness. Patient has some associated headaches and revision without any focal neurological deficit. I feel that this is most likely related to irritation from the nodule. Patient encouraged follow-up for the symptoms have not resolved if the nodule improved.  New Prescriptions New Prescriptions   SULFAMETHOXAZOLE-TRIMETHOPRIM (BACTRIM DS,SEPTRA DS) 800-160 MG TABLET    Take 1 tablet by mouth 2 (two) times daily.     Renne CriglerJoshua Glenden Rossell, PA-C 12/30/15 1022    Linwood DibblesJon Knapp, MD 12/31/15 [phone]

## 2016-06-20 ENCOUNTER — Encounter (HOSPITAL_BASED_OUTPATIENT_CLINIC_OR_DEPARTMENT_OTHER): Payer: Self-pay | Admitting: *Deleted

## 2016-06-20 ENCOUNTER — Emergency Department (HOSPITAL_BASED_OUTPATIENT_CLINIC_OR_DEPARTMENT_OTHER)
Admission: EM | Admit: 2016-06-20 | Discharge: 2016-06-20 | Disposition: A | Discharge status: Home / Self Care | Payer: BLUE CROSS/BLUE SHIELD | Attending: Emergency Medicine | Admitting: Emergency Medicine

## 2016-06-20 DIAGNOSIS — L723 Sebaceous cyst: Secondary | ICD-10-CM | POA: Insufficient documentation

## 2016-06-20 DIAGNOSIS — H6003 Abscess of external ear, bilateral: Secondary | ICD-10-CM | POA: Diagnosis present

## 2016-06-20 DIAGNOSIS — Z3201 Encounter for pregnancy test, result positive: Secondary | ICD-10-CM | POA: Insufficient documentation

## 2016-06-20 DIAGNOSIS — F17210 Nicotine dependence, cigarettes, uncomplicated: Secondary | ICD-10-CM | POA: Diagnosis not present

## 2016-06-20 DIAGNOSIS — Z331 Pregnant state, incidental: Secondary | ICD-10-CM | POA: Insufficient documentation

## 2016-06-20 LAB — PREGNANCY, URINE: PREG TEST UR: POSITIVE — AB

## 2016-06-20 MED ORDER — SODIUM BICARBONATE 4 % IV SOLN
5.0000 mL | Freq: Once | INTRAVENOUS | Status: AC
  Administered 2016-06-20: 5 mL via INTRAVENOUS
  Filled 2016-06-20: qty 5

## 2016-06-20 MED ORDER — IBUPROFEN 400 MG PO TABS
ORAL_TABLET | ORAL | Status: AC
  Filled 2016-06-20: qty 1

## 2016-06-20 MED ORDER — LIDOCAINE HCL (PF) 1 % IJ SOLN
5.0000 mL | Freq: Once | INTRAMUSCULAR | Status: AC
  Administered 2016-06-20: 5 mL via INTRADERMAL
  Filled 2016-06-20: qty 5

## 2016-06-20 MED ORDER — CONCEPT OB 130-92.4-1 MG PO CAPS: 1.0000 | ORAL_CAPSULE | Freq: Every day | ORAL | 12 refills | Status: DC

## 2016-06-20 MED ORDER — IBUPROFEN 400 MG PO TABS
400.0000 mg | ORAL_TABLET | Freq: Once | ORAL | Status: AC | PRN
  Administered 2016-06-20: 400 mg via ORAL
  Filled 2016-06-20: qty 1

## 2016-06-20 NOTE — Discharge Instructions (Signed)
Contact a health care provider if: You have a fever. You have redness, swelling, or pain at the excision site. You have fluid, blood, or pus coming from the excision site. You have ongoing bleeding at the excision site. You have pain that does not improve in 2-3 days after your procedure. You notice skin irregularities or changes in sensation.

## 2016-06-20 NOTE — ED Triage Notes (Signed)
Pt c/o left ear abscess x 1 day

## 2016-06-20 NOTE — ED Notes (Signed)
Pt states she has not urinated all day or had a BM. Pt states she has been drinking water today. Pt states she feels dehydrated.

## 2016-06-20 NOTE — ED Provider Notes (Signed)
[facility] Provider Note   CSN: [id number] Arrival date & time: 06/20/16  1718  By signing my name below, I, Modena Jansky, attest that this documentation has been prepared under the direction and in the presence of non-physician practitioner, Arthor Captain, PA-C. Electronically Signed: Modena Jansky, Scribe. 06/20/2016. 6:05 PM.  History   Chief Complaint Chief Complaint  Patient presents with  . Abscess   The history is provided by the patient. No language interpreter was used.   HPI Comments: Pamela Sparks is a 31 y.o. female who presents to the Emergency Department complaining of constant moderate bilateral ear bumps that started yesterday. She reports she noticed them after waking up in the morning. No modifying factors. She admits to a prior hx of cyst.   She is also concerned about pregnancy. Denies any fever or other complaints at this time.    PCP: Earleen Newport, MD  History reviewed. No pertinent past medical history.  There are no active problems to display for this patient.   History reviewed. No pertinent surgical history.  OB History    No data available       Home Medications    Prior to Admission medications   Medication Sig Start Date End Date Taking? Authorizing Provider  Prenat w/o A Vit-FeFum-FePo-FA (CONCEPT OB) 130-92.4-1 MG CAPS Take 1 capsule by mouth daily. 06/20/16   Arthor Captain, PA-C    Family History History reviewed. No pertinent family history.  Social History Social History  Substance Use Topics  . Smoking status: Current Every Day Smoker    Packs/day: 0.50    Types: Cigarettes  . Smokeless tobacco: Never Used  . Alcohol use No     Allergies   Patient has no known allergies.   Review of Systems Review of Systems  Constitutional: Negative for fever.  Skin:       +Bumps (bilateral ear)  Psychiatric/Behavioral: Negative for confusion.     Physical Exam Updated Vital Signs BP (!) 147/89 (BP  Location: Left Arm)   Pulse (!) 108   Temp 99.1 F (37.3 C)   Resp 17   Ht  (1.575 m)   Wt 120 lb (54.4 kg)   LMP 05/15/2016   SpO2 100%   BMI 21.95 kg/m   Physical Exam  Constitutional: She appears well-developed and well-nourished. No distress.  HENT:  Head: Normocephalic.  Eyes: Conjunctivae are normal.  Neck: Neck supple.  Cardiovascular: Normal rate and regular rhythm.   Pulmonary/Chest: Effort normal.  Abdominal: Soft.  Musculoskeletal: Normal range of motion.  Neurological: She is alert.  Skin: Skin is warm and dry.  Multiple black heads and cyst on the bilateral ears. On the left posterior pinna there is a 3 cm inflamed cyst that is fluctuant and tender. No signs of surrounding cellulitis.   Psychiatric: She has a normal mood and affect.  Nursing note and vitals reviewed.  INCISION AND DRAINAGE Performed by: Arthor Captain Consent: Verbal consent obtained. Risks and benefits: risks, benefits and alternatives were discussed Type: abscess  Body area: left ear  Anesthesia: local infiltration  Incision was made with a scalpel.  Local anesthetic: lidocaine 1% w/o epinephrine +bicarb Anesthetic total: 4 ml  Complexity: complex Blunt dissection to break up loculations  Drainage: purulent/ sebaceous  Drainage amount: moderate    Patient tolerance: Patient tolerated the procedure well with no immediate complications.     ED Treatments / Results  DIAGNOSTIC STUDIES: Oxygen Saturation is 100% on RA, normal by my interpretation.  COORDINATION OF CARE: 6:09 PM- Pt advised of plan for treatment and pt agrees.  Labs (all labs ordered are listed, but only abnormal results are displayed) Labs Reviewed  PREGNANCY, URINE - Abnormal; Notable for the following:       Result Value   Preg Test, Ur POSITIVE (*)    All other components within normal limits    EKG  EKG Interpretation None       Radiology No results  found.  Procedures Procedures (including critical care time)  Medications Ordered in ED Medications  ibuprofen (ADVIL,MOTRIN) tablet 400 mg (400 mg Oral Given 06/20/16 1741)  lidocaine (PF) (XYLOCAINE) 1 % injection 5 mL (5 mLs Intradermal Given 06/20/16 1753)  lidocaine (PF) (XYLOCAINE) 1 % injection 5 mL (5 mLs Intradermal Given 06/20/16 1753)  sodium bicarbonate (NEUT) 4 % injection 5 mL (5 mLs Intravenous Given 06/20/16 1753)     Initial Impression / Assessment and Plan / ED Course  I have reviewed the triage vital signs and the nursing notes.  Pertinent labs & imaging results that were available during my care of the patient were reviewed by me and considered in my medical decision making (see chart for details).      Patient with skin abscess amenable to incision and drainage.  Abscess was not large enough to warrant packing or drain,  wound recheck in 2 days. Encouraged home warm soaks and flushing.  Mild signs of cellulitis is surrounding skin.  Will d/c to home.  No antibiotic therapy is indicated.  Patient also found to be incidentally pregnant.   Final Clinical Impressions(s) / ED Diagnoses   Final diagnoses:  Inflamed sebaceous cyst  Incidental pregnancy confirmed    New Prescriptions New Prescriptions   PRENAT W/O A VIT-FEFUM-FEPO-FA (CONCEPT OB) 130-92.4-1 MG CAPS    Take 1 capsule by mouth daily.   I personally performed the services described in this documentation, which was scribed in my presence. The recorded information has been reviewed and is accurate.       Arthor Captain, PA-C 06/23/16 1735    Canary Brim Tegeler, MD 06/23/16 1929

## 2016-11-08 ENCOUNTER — Emergency Department (HOSPITAL_BASED_OUTPATIENT_CLINIC_OR_DEPARTMENT_OTHER)
Admission: EM | Admit: 2016-11-08 | Discharge: 2016-11-08 | Disposition: A | Discharge status: Home / Self Care | Payer: BLUE CROSS/BLUE SHIELD | Attending: Emergency Medicine | Admitting: Emergency Medicine

## 2016-11-08 ENCOUNTER — Encounter (HOSPITAL_BASED_OUTPATIENT_CLINIC_OR_DEPARTMENT_OTHER): Payer: Self-pay | Admitting: Emergency Medicine

## 2016-11-08 DIAGNOSIS — Y939 Activity, unspecified: Secondary | ICD-10-CM | POA: Diagnosis not present

## 2016-11-08 DIAGNOSIS — Y999 Unspecified external cause status: Secondary | ICD-10-CM | POA: Diagnosis not present

## 2016-11-08 DIAGNOSIS — Y929 Unspecified place or not applicable: Secondary | ICD-10-CM | POA: Insufficient documentation

## 2016-11-08 DIAGNOSIS — X58XXXA Exposure to other specified factors, initial encounter: Secondary | ICD-10-CM | POA: Diagnosis not present

## 2016-11-08 DIAGNOSIS — T148XXA Other injury of unspecified body region, initial encounter: Secondary | ICD-10-CM

## 2016-11-08 DIAGNOSIS — F17210 Nicotine dependence, cigarettes, uncomplicated: Secondary | ICD-10-CM | POA: Diagnosis not present

## 2016-11-08 DIAGNOSIS — S70311A Abrasion, right thigh, initial encounter: Secondary | ICD-10-CM | POA: Diagnosis not present

## 2016-11-08 DIAGNOSIS — J069 Acute upper respiratory infection, unspecified: Secondary | ICD-10-CM | POA: Diagnosis not present

## 2016-11-08 DIAGNOSIS — R21 Rash and other nonspecific skin eruption: Secondary | ICD-10-CM | POA: Diagnosis present

## 2016-11-08 NOTE — ED Triage Notes (Signed)
Pt has some marks to right thigh which appear like bruising to skin.  Pt also has one to left thigh.  Pt also having cold symptoms.  No changes in detergents.  No new medications.

## 2016-11-08 NOTE — Discharge Instructions (Signed)
Please continue symptomatic care for your cold. I would expect improvement over the next couple of days.

## 2016-11-08 NOTE — ED Provider Notes (Signed)
[facility]   CSN: [id number] Arrival date & time: 11/08/16  1038     History   Chief Complaint Chief Complaint  Patient presents with  . Rash    HPI Pamela Sparks is a 31 y.o. female.  HPI  Patient with no significant past medical history presenting with a rash. Patient has been fighting a cold for several days. She with significant coughing congestion. She states that she's also felt a little bit feverish a couple days ago and then may be some this morning. She is has not checked her temperature. Patient notes this morning she woke up and noticed her on her right thigh she described as a possible bruise. No nausea, no vomiting no diarrhea no abdominal pain, no shortness of breath   No past medical history on file.  There are no active problems to display for this patient.   No past surgical history on file.  OB History    No data available       Home Medications    Prior to Admission medications   Not on File    Family History No family history on file.  Social History Social History  Substance Use Topics  . Smoking status: Current Every Day Smoker    Packs/day: 0.50    Types: Cigarettes  . Smokeless tobacco: Never Used  . Alcohol use No     Allergies   Patient has no known allergies.   Review of Systems Review of Systems  Constitutional: Positive for chills. Negative for fever.  HENT: Positive for congestion and rhinorrhea.   Respiratory: Positive for cough. Negative for shortness of breath.   Cardiovascular: Negative for chest pain.  Gastrointestinal: Negative for abdominal pain, nausea and vomiting.     Physical Exam Updated Vital Signs BP (!) 131/96   Pulse 96   Temp 99 F (37.2 C)   Resp 16   Ht  (1.575 m)   Wt 56.7 kg (125 lb)   LMP 11/03/2016   SpO2 100%   BMI 22.86 kg/m   Physical Exam  Constitutional: She is oriented to person, place, and time. She appears well-developed and  well-nourished.  HENT:  Head: Normocephalic and atraumatic.  Eyes: Pupils are equal, round, and reactive to light. Conjunctivae are normal.  Neck: Normal range of motion. Neck supple.  Cardiovascular: Normal rate and regular rhythm.   Pulmonary/Chest: Effort normal and breath sounds normal.  Abdominal: Soft. Bowel sounds are normal.  Musculoskeletal: Normal range of motion.  Neurological: She is alert and oriented to person, place, and time.  Skin:  3 linear erythematous streaks across patient's right thigh consistent with a scratch. Noted patient has long fingernails.     ED Treatments / Results  Labs (all labs ordered are listed, but only abnormal results are displayed) Labs Reviewed - No data to display  EKG  EKG Interpretation None       Radiology No results found.  Procedures Procedures (including critical care time)  Medications Ordered in ED Medications - No data to display   Initial Impression / Assessment and Plan / ED Course  I have reviewed the triage vital signs and the nursing notes.  Pertinent labs & imaging results that were available during my care of the patient were reviewed by me and considered in my medical decision making (see chart for details).    Patient  presenting with URI symptoms. An exam that is consistent with URI. Patient reassured that "rash" is  likely from patient scratching herself on the thigh at night. Symptomatic treatment for URI, patient has adequate medication at home and does not need any further prescription Final Clinical Impressions(s) / ED Diagnoses   Final diagnoses:  Scratch mark  Viral upper respiratory tract infection    New Prescriptions New Prescriptions   No medications on file     Berton Bon, MD 11/08/16 1130    Cathren Laine, MD 11/08/16 1228

## 2016-11-22 ENCOUNTER — Encounter (HOSPITAL_BASED_OUTPATIENT_CLINIC_OR_DEPARTMENT_OTHER): Payer: Self-pay | Admitting: *Deleted

## 2016-11-22 DIAGNOSIS — M542 Cervicalgia: Secondary | ICD-10-CM | POA: Insufficient documentation

## 2016-11-22 DIAGNOSIS — Y92410 Unspecified street and highway as the place of occurrence of the external cause: Secondary | ICD-10-CM | POA: Insufficient documentation

## 2016-11-22 DIAGNOSIS — Y998 Other external cause status: Secondary | ICD-10-CM | POA: Insufficient documentation

## 2016-11-22 DIAGNOSIS — Y9389 Activity, other specified: Secondary | ICD-10-CM | POA: Insufficient documentation
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
## 2016-11-23 ENCOUNTER — Emergency Department (HOSPITAL_BASED_OUTPATIENT_CLINIC_OR_DEPARTMENT_OTHER): Payer: BLUE CROSS/BLUE SHIELD

## 2016-11-23 ENCOUNTER — Emergency Department (HOSPITAL_BASED_OUTPATIENT_CLINIC_OR_DEPARTMENT_OTHER)
Admission: EM | Admit: 2016-11-23 | Discharge: 2016-11-23 | Disposition: A | Discharge status: Home / Self Care | Payer: BLUE CROSS/BLUE SHIELD | Attending: Emergency Medicine | Admitting: Emergency Medicine

## 2016-11-23 DIAGNOSIS — M542 Cervicalgia: Secondary | ICD-10-CM | POA: Diagnosis not present

## 2016-11-23 LAB — URINALYSIS, ROUTINE W REFLEX MICROSCOPIC
Glucose, UA: NEGATIVE mg/dL
Ketones, ur: 15 mg/dL — AB
Leukocytes, UA: NEGATIVE
Nitrite: NEGATIVE
Protein, ur: 30 mg/dL — AB
pH: 6 (ref 5.0–8.0)

## 2016-11-23 LAB — URINALYSIS, MICROSCOPIC (REFLEX): WBC, UA: NONE SEEN WBC/hpf (ref 0–5)

## 2016-11-23 LAB — PREGNANCY, URINE: Preg Test, Ur: NEGATIVE

## 2016-11-23 MED ORDER — NAPROXEN 500 MG PO TABS: 500.0000 mg | ORAL_TABLET | Freq: Two times a day (BID) | ORAL | 0 refills | Status: AC

## 2016-11-23 MED ORDER — METAXALONE 800 MG PO TABS: 800.0000 mg | ORAL_TABLET | Freq: Three times a day (TID) | ORAL | 0 refills | Status: AC

## 2016-11-23 MED ORDER — ACETAMINOPHEN 500 MG PO TABS
1000.0000 mg | ORAL_TABLET | Freq: Once | ORAL | Status: AC
  Administered 2016-11-23: 1000 mg via ORAL
  Filled 2016-11-23: qty 2

## 2016-11-23 MED ORDER — IBUPROFEN 800 MG PO TABS
800.0000 mg | ORAL_TABLET | Freq: Once | ORAL | Status: AC
  Administered 2016-11-23: 800 mg via ORAL
  Filled 2016-11-23: qty 1

## 2016-11-23 NOTE — ED Notes (Signed)
Alert, NAD, calm, interactive, resps e/u, speaking in clear complete sentences, no dyspnea noted, skin W&D, VSS, watching TV, c-collar remains in place.
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
## 2016-11-23 NOTE — ED Notes (Signed)
Pt walked to restroom to obtain urine sample.

## 2016-11-23 NOTE — ED Notes (Signed)
Pt reports pain at base of neck. Headaches all day today. Seeing purple and blue spots.  Burning and tingly feelings at hips.  Discomfort to both shoulders.

## 2016-11-23 NOTE — ED Notes (Signed)
EDP into room, c-collar removed.

## 2016-11-23 NOTE — ED Notes (Signed)
c collar removed per md verbal order
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
## 2020-11-17 ENCOUNTER — Other Ambulatory Visit: Payer: Self-pay

## 2020-11-17 ENCOUNTER — Emergency Department (HOSPITAL_BASED_OUTPATIENT_CLINIC_OR_DEPARTMENT_OTHER)
Admission: EM | Admit: 2020-11-17 | Discharge: 2020-11-17 | Disposition: A | Discharge status: Home / Self Care | Payer: Medicaid Other | Attending: Emergency Medicine | Admitting: Emergency Medicine

## 2020-11-17 ENCOUNTER — Encounter (HOSPITAL_BASED_OUTPATIENT_CLINIC_OR_DEPARTMENT_OTHER): Payer: Self-pay

## 2020-11-17 DIAGNOSIS — Z79899 Other long term (current) drug therapy: Secondary | ICD-10-CM | POA: Insufficient documentation

## 2020-11-17 DIAGNOSIS — R42 Dizziness and giddiness: Secondary | ICD-10-CM | POA: Diagnosis not present

## 2020-11-17 DIAGNOSIS — M79671 Pain in right foot: Secondary | ICD-10-CM | POA: Insufficient documentation

## 2020-11-17 DIAGNOSIS — R200 Anesthesia of skin: Secondary | ICD-10-CM | POA: Insufficient documentation

## 2020-11-17 DIAGNOSIS — M545 Low back pain, unspecified: Secondary | ICD-10-CM | POA: Diagnosis not present

## 2020-11-17 DIAGNOSIS — F17210 Nicotine dependence, cigarettes, uncomplicated: Secondary | ICD-10-CM | POA: Diagnosis not present

## 2020-11-17 DIAGNOSIS — M79672 Pain in left foot: Secondary | ICD-10-CM | POA: Insufficient documentation

## 2020-11-17 DIAGNOSIS — M549 Dorsalgia, unspecified: Secondary | ICD-10-CM | POA: Diagnosis present

## 2020-11-17 MED ORDER — METHOCARBAMOL 500 MG PO TABS: 500.0000 mg | ORAL_TABLET | Freq: Three times a day (TID) | ORAL | 0 refills | Status: AC | PRN

## 2020-11-17 MED ORDER — KETOROLAC TROMETHAMINE 30 MG/ML IJ SOLN
30.0000 mg | Freq: Once | INTRAMUSCULAR | Status: AC
  Administered 2020-11-17: 30 mg via INTRAMUSCULAR
  Filled 2020-11-17: qty 1

## 2020-11-17 NOTE — ED Provider Notes (Signed)
[facility] EMERGENCY DEPARTMENT Provider Note   CSN: [id number] Arrival date & time: 11/17/20  1309     History Chief Complaint  Patient presents with   Back Pain    Pamela Sparks is a 35 y.o. female.   Back Pain Associated symptoms: numbness   Associated symptoms: no abdominal pain and no chest pain   Patient presents with back pain over the last month.  Has had prior to this patient.  Dull.  Also has pain in both hands..  Severe.  States she needs.  No numbness or weakness.  No dizziness.  No loss of bladder or bowel control.  States she does feel unsteady at times with it.  Has seen PCP and was given steroids and Neurontin Mobic.  Has had Pain control.  Had blood work done.  Reviewed results with her and she will try.  States she has felt lightheaded at times with standing.  No history of anemia.  Has follow-up with her PCP on Monday with today being Friday.    History reviewed. No pertinent past medical history.  There are no problems to display for this patient.   History reviewed. No pertinent surgical history.   OB History   No obstetric history on file.     No family history on file.  Social History   Tobacco Use   Smoking status: Every Day    Packs/day: 0.50    Types: Cigarettes   Smokeless tobacco: Never  Vaping Use   Vaping Use: Never used  Substance Use Topics   Alcohol use: Yes    Comment: occ   Drug use: No    Home Medications Prior to Admission medications   Medication Sig Start Date End Date Taking? Authorizing Provider  gabapentin (NEURONTIN) 300 MG capsule Take 300 mg by mouth 3 (three) times daily.   Yes [provider]  methocarbamol (ROBAXIN) 500 MG tablet Take 1 tablet (500 mg total) by mouth every 8 (eight) hours as needed for muscle spasms. 11/17/20  Yes Benjiman Core, MD  traMADol (ULTRAM) 50 MG tablet Take by mouth every 6 (six) hours as needed.   Yes [provider]  metaxalone (SKELAXIN) 800 MG  tablet Take 1 tablet (800 mg total) by mouth 3 (three) times daily. Patient not taking: Reported on 11/17/2020 11/23/16   Palumbo, April, MD  naproxen (NAPROSYN) 500 MG tablet Take 1 tablet (500 mg total) by mouth 2 (two) times daily. 11/23/16   Palumbo, April, MD    Allergies    Patient has no known allergies.  Review of Systems   Review of Systems  Constitutional:  Negative for appetite change.  HENT:  Negative for congestion.   Respiratory:  Negative for shortness of breath.   Cardiovascular:  Negative for chest pain.  Gastrointestinal:  Negative for abdominal pain.  Genitourinary:  Negative for flank pain.  Musculoskeletal:  Positive for back pain.  Skin:  Negative for wound.  Neurological:  Positive for light-headedness and numbness.  Psychiatric/Behavioral:  Negative for confusion.    Physical Exam Updated Vital Signs BP (!) 129/91   Pulse 78   Temp 98.4 F (36.9 C) (Oral)   Resp 18   Ht 5\' 2"  (1.575 m)   Wt 64 kg   LMP 11/14/2020   SpO2 100%   BMI 25.79 kg/m   Physical Exam Vitals and nursing note reviewed.  Constitutional:      Appearance: Normal appearance.  HENT:     Head: Atraumatic.  Eyes:     Pupils: Pupils are equal, round, and reactive to light.  Cardiovascular:     Rate and Rhythm: Regular rhythm.  Pulmonary:     Breath sounds: No wheezing or rhonchi.  Abdominal:     Tenderness: There is no abdominal tenderness.  Musculoskeletal:        General: Tenderness present.     Cervical back: Neck supple.     Comments: Mild lumbar tenderness.  No deformity.  Good straight leg raise bilaterally.  Skin:    Capillary Refill: Capillary refill takes less than 2 seconds.  Neurological:     Mental Status: She is alert.     Comments: Neurovascular intact in bilateral feet.  Sensation grossly intact.  Good flexion and extension at the ankles.    ED Results / Procedures / Treatments   Labs (all labs ordered are listed, but only abnormal results are  displayed) Labs Reviewed - No data to display  EKG None  Radiology No results found.  Procedures Procedures   Medications Ordered in ED Medications  ketorolac (TORADOL) 30 MG/ML injection 30 mg (30 mg Intramuscular Given 11/17/20 1512)    ED Course  I have reviewed the triage vital signs and the nursing notes.  Pertinent labs & imaging results that were available during my care of the patient were reviewed by me and considered in my medical decision making (see chart for details).    MDM Rules/Calculators/A&P                          Patient with back pain in the last month.  Has had steroids for outpatient.  Also for autoimmune some other causes it was reassuring.  Already had x-ray imaging.  Has followed Monday PCP MRIs.  Patient controlled.  We will try muscle relaxer.  Given shot of Toradol to help with symptom clear.  Patient's family members have seen Jordan Likes in the past and she would like to follow-up with him also.  Will discharge Final Clinical Impression(s) / ED Diagnoses Final diagnoses:  Acute low back pain, unspecified back pain laterality, unspecified whether sciatica present  Pain in both feet    Rx / DC Orders ED Discharge Orders          Ordered    methocarbamol (ROBAXIN) 500 MG tablet  Every 8 hours PRN        11/17/20 1524             Benjiman Core, MD 11/17/20 1539

## 2020-11-17 NOTE — ED Triage Notes (Addendum)
Pt c/o pain to lower back and bilat LE x 1 month-denies injury-states she has been seen by PCP with xray and meds-states she does not know dx/plan of care-NAD-steady gait

## 2020-11-24 ENCOUNTER — Ambulatory Visit: Payer: Medicaid Other | Admitting: Family Medicine

## 2020-11-24 NOTE — Progress Notes (Deleted)
  Pamela Sparks - 35 y.o. female MRN [id number]  Date of birth: [birth year]  SUBJECTIVE:  Including CC & ROS.  No chief complaint on file.   Pamela Sparks is a 35 y.o. female that is  ***.  ***   Review of Systems See HPI   HISTORY: Past Medical, Surgical, Social, and Family History Reviewed & Updated per EMR.   Pertinent Historical Findings include:  No past medical history on file.  No past surgical history on file.  No family history on file.  Social History   Socioeconomic History   Marital status: Single    Spouse name: Not on file   Number of children: Not on file   Years of education: Not on file   Highest education level: Not on file  Occupational History   Not on file  Tobacco Use   Smoking status: Every Day    Packs/day: 0.50    Types: Cigarettes   Smokeless tobacco: Never  Vaping Use   Vaping Use: Never used  Substance and Sexual Activity   Alcohol use: Yes    Comment: occ   Drug use: No   Sexual activity: Not on file  Other Topics Concern   Not on file  Social History Narrative   Not on file   Social Determinants of Health   Financial Resource Strain: Not on file  Food Insecurity: Not on file  Transportation Needs: Not on file  Physical Activity: Not on file  Stress: Not on file  Social Connections: Not on file  Intimate Partner Violence: Not on file     PHYSICAL EXAM:  VS: LMP 11/14/2020  Physical Exam Gen: NAD, alert, cooperative with exam, well-appearing MSK:  ***      ASSESSMENT & PLAN:   No problem-specific Assessment & Plan notes found for this encounter.

## 2022-01-28 ENCOUNTER — Emergency Department (HOSPITAL_BASED_OUTPATIENT_CLINIC_OR_DEPARTMENT_OTHER)
Admission: EM | Admit: 2022-01-28 | Discharge: 2022-01-28 | Discharge status: Against Medical Advice | Payer: Medicaid Other | Source: Home / Self Care

## 2022-05-17 ENCOUNTER — Emergency Department (HOSPITAL_BASED_OUTPATIENT_CLINIC_OR_DEPARTMENT_OTHER): Payer: Medicaid Other

## 2022-05-17 ENCOUNTER — Other Ambulatory Visit: Payer: Self-pay

## 2022-05-17 ENCOUNTER — Emergency Department (HOSPITAL_BASED_OUTPATIENT_CLINIC_OR_DEPARTMENT_OTHER)
Admission: EM | Admit: 2022-05-17 | Discharge: 2022-05-17 | Disposition: A | Discharge status: Home / Self Care | Payer: Medicaid Other | Attending: Emergency Medicine | Admitting: Emergency Medicine

## 2022-05-17 ENCOUNTER — Encounter (HOSPITAL_BASED_OUTPATIENT_CLINIC_OR_DEPARTMENT_OTHER): Payer: Self-pay

## 2022-05-17 DIAGNOSIS — F17210 Nicotine dependence, cigarettes, uncomplicated: Secondary | ICD-10-CM | POA: Diagnosis not present

## 2022-05-17 DIAGNOSIS — K920 Hematemesis: Secondary | ICD-10-CM | POA: Diagnosis present

## 2022-05-17 DIAGNOSIS — R1013 Epigastric pain: Secondary | ICD-10-CM | POA: Diagnosis not present

## 2022-05-17 DIAGNOSIS — R000 Tachycardia, unspecified: Secondary | ICD-10-CM | POA: Diagnosis not present

## 2022-05-17 DIAGNOSIS — R1010 Upper abdominal pain, unspecified: Secondary | ICD-10-CM | POA: Insufficient documentation

## 2022-05-17 LAB — CBC
HCT: 40.2 % (ref 36.0–46.0)
Hemoglobin: 13.5 g/dL (ref 12.0–15.0)
MCH: 28 pg (ref 26.0–34.0)
MCHC: 33.6 g/dL (ref 30.0–36.0)
MCV: 83.2 fL (ref 80.0–100.0)
Platelets: 311 10*3/uL (ref 150–400)
RBC: 4.83 MIL/uL (ref 3.87–5.11)
RDW: 15.9 % — ABNORMAL HIGH (ref 11.5–15.5)
WBC: 8.2 10*3/uL (ref 4.0–10.5)
nRBC: 0 % (ref 0.0–0.2)

## 2022-05-17 LAB — COMPREHENSIVE METABOLIC PANEL
ALT: 28 U/L (ref 0–44)
AST: 62 U/L — ABNORMAL HIGH (ref 15–41)
Albumin: 3.9 g/dL (ref 3.5–5.0)
Alkaline Phosphatase: 88 U/L (ref 38–126)
Anion gap: 10 (ref 5–15)
BUN: <5 mg/dL — ABNORMAL LOW (ref 6–20)
CO2: 24 mmol/L (ref 22–32)
Calcium: 8.5 mg/dL — ABNORMAL LOW (ref 8.9–10.3)
Chloride: 101 mmol/L (ref 98–111)
Creatinine, Ser: 0.64 mg/dL (ref 0.44–1.00)
GFR, Estimated: >60 mL/min (ref >60)
Glucose, Bld: 85 mg/dL (ref 70–99)
Potassium: 3.7 mmol/L (ref 3.5–5.1)
Sodium: 135 mmol/L (ref 135–145)
Total Bilirubin: 0.6 mg/dL (ref 0.3–1.2)
Total Protein: 8.2 g/dL — ABNORMAL HIGH (ref 6.5–8.1)

## 2022-05-17 LAB — LIPASE, BLOOD: Lipase: 33 U/L (ref 11–51)

## 2022-05-17 LAB — PREGNANCY, URINE: Preg Test, Ur: NEGATIVE

## 2022-05-17 LAB — OCCULT BLOOD X 1 CARD TO LAB, STOOL: Fecal Occult Bld: NEGATIVE

## 2022-05-17 MED ORDER — SODIUM CHLORIDE 0.9 % IV BOLUS
1000.0000 mL | Freq: Once | INTRAVENOUS | Status: AC
  Administered 2022-05-17: 1000 mL via INTRAVENOUS

## 2022-05-17 MED ORDER — PANTOPRAZOLE SODIUM 40 MG IV SOLR
40.0000 mg | Freq: Once | INTRAVENOUS | Status: AC
  Administered 2022-05-17: 40 mg via INTRAVENOUS
  Filled 2022-05-17: qty 10

## 2022-05-17 MED ORDER — ONDANSETRON HCL 4 MG/2ML IJ SOLN
4.0000 mg | Freq: Once | INTRAMUSCULAR | Status: AC
  Administered 2022-05-17: 4 mg via INTRAVENOUS
  Filled 2022-05-17: qty 2

## 2022-05-17 MED ORDER — ONDANSETRON 4 MG PO TBDP: 4.0000 mg | ORAL_TABLET | Freq: Four times a day (QID) | ORAL | 0 refills | Status: AC | PRN

## 2022-05-17 MED ORDER — PANTOPRAZOLE SODIUM 40 MG PO TBEC: 40.0000 mg | DELAYED_RELEASE_TABLET | Freq: Every day | ORAL | 0 refills | Status: AC

## 2022-05-17 NOTE — ED Triage Notes (Signed)
Pt via POV c/o abdominal pain, vomiting blood, dehydration x 3 days. Pt says her kids have been sick with cold lately but no serious illness. No GI history. Reports 6 episodes of frankly bloody emesis today. No diarrhea but stool has lots of mucous in it. Pain rated 8/10 generalized abdomen.

## 2022-05-17 NOTE — Discharge Instructions (Addendum)
Please take your medications as prescribed.  Please make sure you are staying well-hydrated drinking plenty of fluids, any water.  I included information about hematemesis into this discharge paperwork.  Additionally, the information for a GI specialist included.  Please call to schedule appointment. I recommend cutting back on your alcohol intake and eating more of a bland diet over the next few days. If you have any concerns, new or worsening symptoms, please return to the nearest emergency department for evaluation.  Contact a health care provider if: You have more blood in your vomit. Your vomiting of blood begins again after it has stopped. You have persistent stomach pain. You have nausea, indigestion, or heartburn. Get help right away if: You faint. You feel weak or dizzy. You are urinating less than normal or not at all. You vomit up: Large amounts of blood or dark material that may look like coffee grounds. Bright red blood. You have any of the following: Persistent vomiting. A rapid heartbeat. Blood in your stool. Chest pain. Difficulty breathing. These symptoms may be an emergency. Get help right away. Call 911. Do not wait to see if the symptoms will go away. Do not drive yourself to the hospital.

## 2022-05-17 NOTE — ED Provider Notes (Signed)
[facility] Provider Note   CSN: [id number] Arrival date & time: 05/17/22  1511     History  Chief Complaint  Patient presents with   Hematemesis    Pamela Sparks is a 37 y.o. female.  She denies any chronic medical conditions, she does smoke cigarettes about a pack a day.  Presents ER today for 4 days of nausea vomiting and diarrhea.  She states that every time she tries to eat or drink anything she vomits.  She has been for several days it was just stomach acid and blood if she ate but noted about 4-5 times a day she had some blood mixed in with the stomach acid that was described as bright red.  Denies any blood in her stool or black or tarry stool.  No history of GI bleeding, denies excessive NSAID use, no history of alcohol abuse.  States she drinks alcohol occasionally but not daily and does not drink to excess.  She admits to some upper abdominal pain that she states feels like muscle soreness from excessive vomiting  HPI     Home Medications Prior to Admission medications   Medication Sig Start Date End Date Taking? Authorizing Provider  ondansetron (ZOFRAN-ODT) 4 MG disintegrating tablet Take 1 tablet (4 mg total) by mouth every 6 (six) hours as needed for nausea or vomiting. 05/17/22  Yes Amedeo Gory, Benino Korinek A, PA-C  pantoprazole (PROTONIX) 40 MG tablet Take 1 tablet (40 mg total) by mouth daily. 05/17/22  Yes Jazz Rogala A, PA-C  gabapentin (NEURONTIN) 300 MG capsule Take 300 mg by mouth 3 (three) times daily.    [provider]  metaxalone (SKELAXIN) 800 MG tablet Take 1 tablet (800 mg total) by mouth 3 (three) times daily. Patient not taking: Reported on 11/17/2020 11/23/16   Randal Buba, April, MD  methocarbamol (ROBAXIN) 500 MG tablet Take 1 tablet (500 mg total) by mouth every 8 (eight) hours as needed for muscle spasms. 11/17/20   Davonna Belling, MD  naproxen (NAPROSYN) 500 MG tablet Take 1 tablet (500 mg total) by  mouth 2 (two) times daily. 11/23/16   Palumbo, April, MD  traMADol (ULTRAM) 50 MG tablet Take by mouth every 6 (six) hours as needed.    [provider]      Allergies    Patient has no known allergies.    Review of Systems   Review of Systems  Physical Exam Updated Vital Signs BP (!) 146/102   Pulse (!) 120   Temp 98.5 F (36.9 C)   Resp 18   Ht 5\' 2"  (1.575 m)   Wt 61.2 kg   LMP 05/03/2022 (Approximate)   SpO2 99%   BMI 24.69 kg/m  Physical Exam Vitals and nursing note reviewed. Exam conducted with a chaperone present.  Constitutional:      General: She is not in acute distress.    Appearance: She is well-developed.  HENT:     Head: Normocephalic and atraumatic.  Eyes:     Conjunctiva/sclera: Conjunctivae normal.  Cardiovascular:     Rate and Rhythm: Regular rhythm. Tachycardia present.     Heart sounds: No murmur heard. Pulmonary:     Effort: Pulmonary effort is normal. No respiratory distress.     Breath sounds: Normal breath sounds.  Chest:     Chest wall: No crepitus.  Abdominal:     Palpations: Abdomen is soft.     Tenderness: There is abdominal tenderness in the epigastric area.  There is no right CVA tenderness, left CVA tenderness, guarding or rebound. Negative signs include Murphy's sign, Rovsing's sign and McBurney's sign.     Comments: Mild epigastric tenderness,   Genitourinary:    Rectum: Normal.  Musculoskeletal:        General: No swelling.     Cervical back: Neck supple.  Skin:    General: Skin is warm and dry.     Capillary Refill: Capillary refill takes less than 2 seconds.  Neurological:     General: No focal deficit present.     Mental Status: She is alert and oriented to person, place, and time.  Psychiatric:        Mood and Affect: Mood normal.     ED Results / Procedures / Treatments   Labs (all labs ordered are listed, but only abnormal results are displayed) Labs Reviewed  COMPREHENSIVE METABOLIC PANEL - Abnormal;  Notable for the following components:      Result Value   BUN <5 (*)    Calcium 8.5 (*)    Total Protein 8.2 (*)    AST 62 (*)    All other components within normal limits  CBC - Abnormal; Notable for the following components:   RDW 15.9 (*)    All other components within normal limits  PREGNANCY, URINE  OCCULT BLOOD X 1 CARD TO LAB, STOOL  POC OCCULT BLOOD, ED    EKG None  Radiology No results found.  Procedures Procedures    Medications Ordered in ED Medications  sodium chloride 0.9 % bolus 1,000 mL (1,000 mLs Intravenous New Bag/Given 05/17/22 1841)  ondansetron (ZOFRAN) injection 4 mg (4 mg Intravenous Given 05/17/22 1841)  pantoprazole (PROTONIX) injection 40 mg (40 mg Intravenous Given 05/17/22 1842)    ED Course/ Medical Decision Making/ A&P                             Medical Decision Making Ddx: mallory weiss tear, gastroenteritis, gastritis, syndrome, peptic ulcer disease, cholecystitis, pancreatitis, other ED course: Patient presents the ED for nausea vomiting diarrhea for 4 days noting hematemesis today.  Describes bright red blood mixed with yellow stomach acid.  No chest pain or chest or neck crepitus to suggest an acute Boerhaave syndrome though will get x-ray to rule out any free air out of an abundance of caution, rectal exam showed a small amount of brown soft stool, guaiac results pending from lab She has very mild epigastric tenderness, discussed risk and benefits of CT.  Patient states she is hungry and is ready to go, agree with waiting for remainder of labs and chest x-ray as well as fluids but states she is feeling better after the Zofran.  I suspect that she likely had gastroenteritis with a Mallory-Weiss tear versus gastritis.  She also got PPI.  Discussed with patient that I am leaving, she is signed out to oncoming team with pending lipase and chest x-ray.  If these are normal plan will be for outpatient GI follow-up with Zofran and PPI at home and  avoidance of NSAIDs and alcohol.  Discussed smoking cessation with the patient as well.  Advised on p.o. hydration and strict follow-up and return precautions.  Amount and/or Complexity of Data Reviewed Labs: ordered. Radiology: ordered.  Risk Prescription drug management.   Signed out to oncoming team after discussion of plan        Final Clinical Impression(s) / ED Diagnoses Final diagnoses:  Hematemesis, unspecified whether nausea present    Rx / DC Orders ED Discharge Orders          Ordered    pantoprazole (PROTONIX) 40 MG tablet  Daily        05/17/22 1849    ondansetron (ZOFRAN-ODT) 4 MG disintegrating tablet  Every 6 hours PRN        05/17/22 1849              Darci Current 05/17/22 Toney Sang, MD 05/17/22 2328

## 2022-05-17 NOTE — ED Provider Notes (Signed)
  Physical Exam  BP 119/79   Pulse 79   Temp 98.4 F (36.9 C) (Oral) Comment: Simultaneous filing. User may not have seen previous data. Comment (Src): Simultaneous filing. User may not have seen previous data.  Resp 17 Comment: Simultaneous filing. User may not have seen previous data.  Ht 5\' 2"  (1.575 m)   Wt 61.2 kg   LMP 05/03/2022 (Approximate)   SpO2 99%   BMI 24.69 kg/m   Physical Exam Vitals and nursing note reviewed.  Constitutional:      General: She is not in acute distress.    Appearance: Normal appearance. She is not ill-appearing or toxic-appearing.  HENT:     Mouth/Throat:     Mouth: Mucous membranes are moist.  Eyes:     General: No scleral icterus. Cardiovascular:     Rate and Rhythm: Normal rate.  Pulmonary:     Effort: Pulmonary effort is normal. No respiratory distress.  Abdominal:     Palpations: Abdomen is soft.     Tenderness: There is no abdominal tenderness. There is no guarding or rebound.     Comments: No abdominal tenderness palpation.  Abdomen is soft.  Skin:    General: Skin is dry.     Findings: No rash.  Neurological:     General: No focal deficit present.     Mental Status: She is alert. Mental status is at baseline.  Psychiatric:        Mood and Affect: Mood normal.     Procedures  Procedures  ED Course / MDM    Medical Decision Making Amount and/or Complexity of Data Reviewed Labs: ordered. Radiology: ordered.  Risk Prescription drug management.   Accepted handoff at shift change from [facility], [place]. Please see prior provider note for more detail.   Briefly: Patient is 37 y.o. female presenting with nausea and vomiting with some blood-streaked emesis.  No crepitus.  Listed for any Boerhaave  DDX: concern for gastritis versus pancreatitis versus peptic ulcer  Plan: Follow-up on Hemoccult and chest x-ray.  Protonix and Zofran already sent in by previous provider.  From reading the previous provider's note, she also  wanted a lipase checked however 1 was not ordered.  I did add this on however the patient is eager to go home.  Will p.o. challenge.    I independently reviewed and interpreted the patient's labs.  Hemoccult is negative.  Lipase is normal.  Chest x-ray shows no acute cardiopulmonary process.  Patient tolerated p.o. challenge without emesis or nausea.  On reevaluation, the patient reports she feels "extremely" better after the IV fluids.  I discussed with her her results and imaging results at bedside.  We discussed the need to follow-up with GI.  Discussed the Zofran and Protonix prescriptions.  We discussed tricked return precautions and red flag symptoms.  Patient verbalized understanding agrees the plan.  Patient is stable being discharged home in good condition.       Sherrell Puller, PA-C 05/17/22 2213    Davonna Belling, MD 05/17/22 2328

## 2022-06-10 ENCOUNTER — Encounter: Payer: Self-pay | Admitting: *Deleted
